# Patient Record
Sex: Male | Born: 1975 | Race: Black or African American | Hispanic: No | Marital: Married | Smoking: Never smoker
Health system: Southern US, Community
[De-identification: ages and names within clinical notes are randomized; demographics above are authoritative.]

## PROBLEM LIST (undated history)

## (undated) DIAGNOSIS — R51 Headache: Secondary | ICD-10-CM

## (undated) DIAGNOSIS — K219 Gastro-esophageal reflux disease without esophagitis: Secondary | ICD-10-CM

## (undated) HISTORY — DX: Headache: R51

## (undated) HISTORY — PX: NO PAST SURGERIES: SHX2092

## (undated) HISTORY — DX: Gastro-esophageal reflux disease without esophagitis: K21.9

---

## 2004-07-16 ENCOUNTER — Encounter: Admission: RE | Admit: 2004-07-16 | Discharge: 2004-07-16 | Payer: Self-pay | Admitting: Internal Medicine

## 2005-03-18 ENCOUNTER — Ambulatory Visit: Payer: Self-pay | Admitting: Family Medicine

## 2005-03-21 ENCOUNTER — Ambulatory Visit (HOSPITAL_COMMUNITY): Admission: RE | Admit: 2005-03-21 | Discharge: 2005-03-21 | Payer: Self-pay | Admitting: Family Medicine

## 2005-05-16 ENCOUNTER — Ambulatory Visit: Payer: Self-pay | Admitting: Internal Medicine

## 2005-06-06 ENCOUNTER — Ambulatory Visit (HOSPITAL_COMMUNITY): Admission: RE | Admit: 2005-06-06 | Discharge: 2005-06-06 | Payer: Self-pay | Admitting: Internal Medicine

## 2005-06-06 ENCOUNTER — Ambulatory Visit: Payer: Self-pay | Admitting: Internal Medicine

## 2005-06-13 ENCOUNTER — Ambulatory Visit (HOSPITAL_COMMUNITY): Admission: RE | Admit: 2005-06-13 | Discharge: 2005-06-13 | Payer: Self-pay | Admitting: Internal Medicine

## 2006-02-20 ENCOUNTER — Ambulatory Visit: Payer: Self-pay | Admitting: Family Medicine

## 2006-09-06 ENCOUNTER — Ambulatory Visit: Payer: Self-pay | Admitting: Family Medicine

## 2006-10-03 DIAGNOSIS — I10 Essential (primary) hypertension: Secondary | ICD-10-CM | POA: Insufficient documentation

## 2006-12-27 ENCOUNTER — Encounter: Payer: Self-pay | Admitting: Family Medicine

## 2006-12-27 ENCOUNTER — Ambulatory Visit: Payer: Self-pay | Admitting: Family Medicine

## 2006-12-27 DIAGNOSIS — J029 Acute pharyngitis, unspecified: Secondary | ICD-10-CM

## 2006-12-27 DIAGNOSIS — J069 Acute upper respiratory infection, unspecified: Secondary | ICD-10-CM | POA: Insufficient documentation

## 2006-12-27 LAB — CONVERTED CEMR LAB: Rapid Strep: NEGATIVE

## 2007-06-09 IMAGING — NM NM HEPATO W/GB/PHARM/[PERSON_NAME]
2 series · 12 of 12 positions shown · non-contrast
Comparison: none

HISTORY: Abdominal pain

[Series 1: hepatobiliary · 3.20mm/px · 6 of 60 frames shown (1 of 2)]
[frame 6/60]
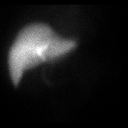
[frame 16/60]
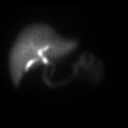
[frame 26/60]
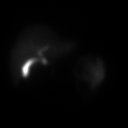
[frame 36/60]
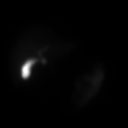
[frame 46/60]
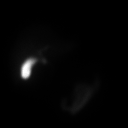
[frame 56/60]
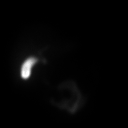

[Series 1: hepatobiliary · 3.20mm/px · 6 of 60 frames shown (2 of 2)]
[frame 6/60]
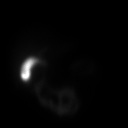
[frame 16/60]
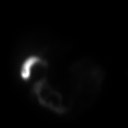
[frame 26/60]
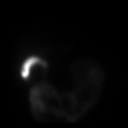
[frame 36/60]
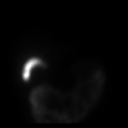
[frame 46/60]
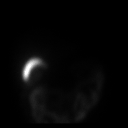
[frame 56/60]
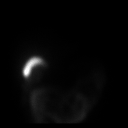

[12 of 12 positions shown; findings below may reference images not displayed]

HEPATOBILIARY SCAN WITH EJECTION FRACTION:

Hepatobiliary imaging performed using 5 mCi Rc-QQm mebrofenin.

Prompt tracer extraction from blood stream, indicating normal hepatocellular
function.
Prompt excretion into biliary tree.
Gallbladder visualized x 15 minutes.
Small bowel activity seen x 10 minutes.
No hepatic retention of tracer.

At 1 hour, patient ingested half-and-half, and imaging was continued for 60
minutes.
Mild emptying of tracer occurs from gallbladder following fatty meal
stimulation.
Calculated gallbladder ejection fraction mildly decreased at 36%.
IMPRESSION: Patent biliary tree.
Mildly decreased gallbladder response to fatty meal stimulation as above.

## 2007-11-05 ENCOUNTER — Ambulatory Visit: Payer: Self-pay | Admitting: Family Medicine

## 2007-11-05 DIAGNOSIS — R1012 Left upper quadrant pain: Secondary | ICD-10-CM

## 2008-02-19 ENCOUNTER — Ambulatory Visit: Payer: Self-pay | Admitting: Family Medicine

## 2008-02-19 DIAGNOSIS — J329 Chronic sinusitis, unspecified: Secondary | ICD-10-CM | POA: Insufficient documentation

## 2008-04-22 ENCOUNTER — Telehealth: Payer: Self-pay | Admitting: Family Medicine

## 2008-06-19 ENCOUNTER — Ambulatory Visit: Payer: Self-pay | Admitting: Family Medicine

## 2008-06-19 DIAGNOSIS — H9190 Unspecified hearing loss, unspecified ear: Secondary | ICD-10-CM | POA: Insufficient documentation

## 2008-06-20 ENCOUNTER — Encounter: Payer: Self-pay | Admitting: Family Medicine

## 2008-06-20 LAB — CONVERTED CEMR LAB
AST: 17 units/L (ref 0–37)
Albumin: 4.7 g/dL (ref 3.5–5.2)
Alkaline Phosphatase: 51 units/L (ref 39–117)
Calcium: 9.5 mg/dL (ref 8.4–10.5)
Cholesterol, target level: 200 mg/dL
Glucose, Bld: 95 mg/dL (ref 70–99)
LDL Cholesterol: 122 mg/dL — ABNORMAL HIGH (ref 0–99)
LDL Goal: 160 mg/dL
TSH: 0.74 microintl units/mL (ref 0.350–5.50)
Total Bilirubin: 0.7 mg/dL (ref 0.3–1.2)
Total CHOL/HDL Ratio: 4.2
Total Protein: 7.7 g/dL (ref 6.0–8.3)

## 2008-09-17 ENCOUNTER — Ambulatory Visit: Payer: Self-pay | Admitting: Family Medicine

## 2008-09-17 DIAGNOSIS — D485 Neoplasm of uncertain behavior of skin: Secondary | ICD-10-CM | POA: Insufficient documentation

## 2010-12-23 ENCOUNTER — Telehealth: Payer: Self-pay | Admitting: Family Medicine

## 2010-12-24 ENCOUNTER — Ambulatory Visit
Admission: RE | Admit: 2010-12-24 | Discharge: 2010-12-24 | Payer: Self-pay | Source: Home / Self Care | Attending: Family Medicine | Admitting: Family Medicine

## 2011-01-05 ENCOUNTER — Encounter: Payer: Self-pay | Admitting: Family Medicine

## 2011-01-05 LAB — CONVERTED CEMR LAB
ALT: 27 units/L (ref 0–53)
Albumin: 5.1 g/dL (ref 3.5–5.2)
Alkaline Phosphatase: 45 units/L (ref 39–117)
BUN: 12 mg/dL (ref 6–23)
Chloride: 100 meq/L (ref 96–112)
Cholesterol: 198 mg/dL (ref 0–200)
Creatinine, Ser: 0.92 mg/dL (ref 0.40–1.50)
HDL: 44 mg/dL (ref >39)
Sodium: 138 meq/L (ref 135–145)
Total Bilirubin: 0.7 mg/dL (ref 0.3–1.2)
Total Protein: 7.8 g/dL (ref 6.0–8.3)

## 2011-01-27 NOTE — Assessment & Plan Note (Signed)
Summary: HTN f/u   Vital Signs:  Patient profile:   35 year old male Height:      75.5 inches Weight:      331 pounds Pulse rate:   64 / minute BP sitting:   144 / 89  (right arm) Cuff size:   large  Vitals Entered By: Avon Gully CMA, Duncan Dull) (December 24, 2010 2:48 PM) CC: f/u HTN, Hypertension Management   Primary Care Rozalyn Osland:  Linford Arnold, C  CC:  f/u HTN and Hypertension Management.  History of Present Illness: BPs have been in teh 130s/80.    Hypertension History:      He denies headache, chest pain, palpitations, dyspnea with exertion, orthopnea, PND, peripheral edema, visual symptoms, neurologic problems, syncope, and side effects from treatment.  He notes no problems with any antihypertensive medication side effects.        Positive major cardiovascular risk factors include hypertension.  Negative major cardiovascular risk factors include male age less than 21 years old, no history of diabetes, negative family history for ischemic heart disease, and non-tobacco-user status.        Further assessment for target organ damage reveals no history of ASHD, stroke/TIA, or peripheral vascular disease.     Current Medications (verified): 1)  Lisinopril-Hydrochlorothiazide 20-25 Mg  Tabs (Lisinopril-Hydrochlorothiazide) .... Take 1 Tablet By Mouth Once A Day  Allergies (verified): No Known Drug Allergies  Comments:  Nurse/Medical Assistant: The patient's medications and allergies were reviewed with the patient and were updated in the Medication and Allergy Lists. Avon Gully CMA, Duncan Dull) (December 24, 2010 2:48 PM)  Physical Exam  General:  Well-developed,well-nourished,in no acute distress; alert,appropriate and cooperative throughout examination Lungs:  Normal respiratory effort, chest expands symmetrically. Lungs are clear to auscultation, no crackles or wheezes. Heart:  Normal rate and regular rhythm. S1 and S2 normal without gallop, murmur, click, rub or other  extra sounds. No carotid bruits. Pulses:  Radial 2+  Extremities:  No LE edema.  Skin:  no rashes.     Impression & Recommendations:  Problem # 1:  HYPERTENSION, BENIGN SYSTEMIC (ICD-401.1) Up a little today but he checks it regulary and it has been at goal.    REfill meds for one year. Needs to get labs. Call if BPs over 140/90.   His updated medication list for this problem includes:    Lisinopril-hydrochlorothiazide 20-25 Mg Tabs (Lisinopril-hydrochlorothiazide) .Marland Kitchen... Take 1 tablet by mouth once a day  Orders: T-Comprehensive Metabolic Panel [phone]) T-Lipid Profile ([id number])  BP today: 144/89 Prior BP: 130/82 (09/17/2008)  Prior 10 Yr Risk Heart Disease: 4 % (06/20/2008)  Labs Reviewed: K+: 4.7 (06/19/2008) Creat: : 1.02 (06/19/2008)   Chol: 186 (06/19/2008)   HDL: 44 (06/19/2008)   LDL: 122 (06/19/2008)   TG: 102 (06/19/2008)  Complete Medication List: 1)  Lisinopril-hydrochlorothiazide 20-25 Mg Tabs (Lisinopril-hydrochlorothiazide) .... Take 1 tablet by mouth once a day  Hypertension Assessment/Plan:      The patient's hypertensive risk group is category A: No risk factors and no target organ damage.  His calculated 10 year risk of coronary heart disease is 7 %.  Today's blood pressure is 144/89.  His blood pressure goal is < 140/90.  Patient Instructions: 1)  Please schedule a follow-up appointment in 1 year.  2)  Please go to the lab in the next couple of weeks. Fast 8 hours. You can have water and take any meds.  Prescriptions: LISINOPRIL-HYDROCHLOROTHIAZIDE 20-25 MG  TABS (LISINOPRIL-HYDROCHLOROTHIAZIDE) Take 1 tablet by mouth once a day  #  90 x 3   Entered and Authorized by:   Nani Gasser MD   Signed by:   Nani Gasser MD on 12/24/2010   Method used:   Electronically to        [facility] [phone]* (retail)       [address] [place], [place]  [address]       Ph: [phone]       Fax: [phone]   RxID:    [id number]    Orders Added: 1)  T-Comprehensive Metabolic Panel [[id number]] 2)  T-Lipid Profile [[id number]] 3)  Est. Patient Level III [[address]]

## 2011-01-27 NOTE — Progress Notes (Signed)
Summary: HTN meds  ---- Converted from flag ---- ---- 12/22/2010 3:29 PM, Janeal Holmes wrote: DR. Linford Arnold,  I CALLED [facility] AND SCHEDULED AN APPOINTMENT TO F/UP HTN/MED RE-FILL FOR THIS FRIDAY 12/24/10. PT STATES HE ONLY HAS 2 PILLS REMAINING.Lynford Humphrey, VENICE  ---- 12/22/2010 8:02 AM, Nani Gasser MD wrote: Pt needs appt. to f/u HTN.  Hasn't been seen in over a year for med refills. ------------------------------  Prescriptions: LISINOPRIL-HYDROCHLOROTHIAZIDE 20-25 MG  TABS (LISINOPRIL-HYDROCHLOROTHIAZIDE) Take 1 tablet by mouth once a day  #30 x 0   Entered by:   Avon Gully CMA, (AAMA)   Authorized by:   Nani Gasser MD   Signed by:   Avon Gully CMA, Duncan Dull) on 12/23/2010   Method used:   Electronically to        [facility] [phone]* (retail)       [address] [place], [place]  [address]       Ph: [phone]       Fax: [phone]   RxID:   [id number]  Call pt: OK we can send 30 day supply to a local pharm. Which pharm would he like? December 29, 20119:36 AM Linford Arnold MD, Santina Evans   1:53 PM December 23, 2010 [place] CMA, Duncan Dull), Sue Lush spoke with pt and will send rx

## 2011-02-05 ENCOUNTER — Encounter: Payer: Self-pay | Admitting: Family Medicine

## 2011-03-03 NOTE — Medication Information (Signed)
Summary: Nonadherence with Lisinopril/[facility]  Nonadherence with Lisinopril/[facility]   Imported By: Lanelle Bal 02/24/2011 12:51:47  _____________________________________________________________________  External Attachment:    Type:   Image     Comment:   External Document

## 2011-05-05 ENCOUNTER — Encounter: Payer: Self-pay | Admitting: Family Medicine

## 2011-05-06 ENCOUNTER — Encounter: Payer: Self-pay | Admitting: Family Medicine

## 2011-05-06 ENCOUNTER — Ambulatory Visit (INDEPENDENT_AMBULATORY_CARE_PROVIDER_SITE_OTHER): Payer: PRIVATE HEALTH INSURANCE | Admitting: Family Medicine

## 2011-05-06 VITALS — BP 125/74 | HR 80 | Wt 318.0 lb

## 2011-05-06 DIAGNOSIS — N390 Urinary tract infection, site not specified: Secondary | ICD-10-CM

## 2011-05-06 LAB — POCT URINALYSIS DIPSTICK
Bilirubin, UA: NEGATIVE
Glucose, UA: NEGATIVE
Ketones, UA: NEGATIVE
Leukocytes, UA: NEGATIVE
Protein, UA: NEGATIVE
Spec Grav, UA: >=1.03

## 2011-05-06 MED ORDER — DOXYCYCLINE HYCLATE 100 MG PO TABS: 100.0000 mg | ORAL_TABLET | Freq: Two times a day (BID) | ORAL | Status: AC

## 2011-05-06 NOTE — Progress Notes (Signed)
  Subjective:    Patient ID: Chad Lawson, male    DOB: August 01, 1976, 35 y.o.   MRN: [id number]  HPI Pelvic pressure. No pain but feels like using the bathroom. Starrted a week ago.  Urine looked dark.  Urine looks more normal now. Did increase water intake and stop drinking soda. No fever.  Maybe some burning with urination. No hx of prostate problems. No raiitaion into the testicles. He has never had a urinary infection before. No problems with his kidneys that he knows of.   Review of Systems     Objective:   Physical Exam  Constitutional: He is oriented to person, place, and time. He appears well-developed and well-nourished.  HENT:  Head: Normocephalic and atraumatic.  Cardiovascular: Normal rate, regular rhythm and normal heart sounds.   Pulmonary/Chest: Effort normal and breath sounds normal.  Abdominal: Soft. Bowel sounds are normal. He exhibits no distension and no mass. There is no tenderness. There is no rebound and no guarding.  Musculoskeletal: He exhibits no edema.  Neurological: He is alert and oriented to person, place, and time.  Skin: Skin is warm and dry.          Assessment & Plan:  Dysuria-UA was negative. It is possible he has prostatitis. All questions and a prescription for doxycycline for 21 days. I did send a urine for culture the likely will be negative. I did not examine the prostate today. If his symptoms do not improve in the next 5-7 days on antibiotic and he is to return for followup visit.

## 2011-05-11 ENCOUNTER — Telehealth: Payer: Self-pay | Admitting: Family Medicine

## 2011-05-11 LAB — URINE CULTURE: Colony Count: NO GROWTH

## 2011-05-11 NOTE — Telephone Encounter (Signed)
Call patient: The urine culture is negative. If he feeling any better on antibiotics?

## 2011-05-11 NOTE — Telephone Encounter (Signed)
Left message on pt's vm with results and to call back if still having sx

## 2011-05-13 NOTE — Op Note (Signed)
NAME:  Chad Lawson, Chad Lawson             ACCOUNT NO.:  [id number]   MEDICAL RECORD NO.:  [id number]          PATIENT TYPE:  AMB   LOCATION:  DAY                           FACILITY:  [facility]   PHYSICIAN:  R. Roetta Sessions, M.D. DATE OF BIRTH:  24-Nov-1976   DATE OF PROCEDURE:  06/06/2005  DATE OF DISCHARGE:                                 OPERATIVE REPORT   PROCEDURE:  Diagnostic esophagogastroduodenoscopy.   INDICATION FOR PROCEDURE:  A 35 year old gentleman with intermittent  epigastric bloating, epigastric and right upper quadrant abdominal  discomfort.  Normal LFTs and gallbladder ultrasound.  He has been on Aciphex  20 mg orally daily for two weeks without much improvement in his symptoms.  EGD is now being done to further evaluate his symptoms.  This approach has  been discussed with the patient at length, the potential risks, benefits,  and alternatives have been reviewed, questions answered.  He is agreeable.  Please see documentation in the medical record.   PROCEDURE NOTE:  O2 saturation, blood pressure, pulse, and respiration were  monitored throughout the entire procedure.   CONSCIOUS SEDATION:  Versed 3 mg IV, Demerol 75 mg IV in divided doses.   INSTRUMENT USED:  Olympus video chip system.   FINDINGS:  Examination of the tubular esophagus revealed no mucosal  abnormalities.  The EG junction was easily traversed.   Stomach:  The gastric cavity was empty and insufflated well with air.  A  thorough examination of the gastric mucosa including a retroflexed view of  the proximal stomach and esophagogastric junction demonstrated no  abnormalities.  The pylorus was patent and easily traversed.  Examination of  the bulb and second portion revealed no abnormalities.   Therapeutic/diagnostic maneuvers performed:  None.   The patient tolerated the procedure well, was reacted in endoscopy.   IMPRESSION:  Normal esophagus, stomach, D1, D2.   RECOMMENDATIONS:  1.  Continue Aciphex 20  mg orally daily.  2.  I believe we should go ahead and evaluate the gallbladder further.  To      this end, we will proceed with a HIDA scan in the very near future.      Further recommendations to follow.       RMR/MEDQ  D:  06/06/2005  T:  06/06/2005  Job:  [id number]   cc:   Dorthula Rue. Early Chars, MD  Fax: [phone]

## 2011-05-13 NOTE — Consult Note (Signed)
NAME:  JAISE, MOSER             ACCOUNT NO.:  [id number]   MEDICAL RECORD NO.:  [id number]          PATIENT TYPE:  AMB   LOCATION:  DAY                           FACILITY:  [facility]   PHYSICIAN:  R. Roetta Sessions, M.D. DATE OF BIRTH:  1976/10/17   DATE OF CONSULTATION:  DATE OF DISCHARGE:                                   CONSULTATION   REASON FOR CONSULTATION:  Upper abdominal discomfort.   HISTORY OF PRESENT ILLNESS:  Mr. Jess Toney is a pleasant 35-  year-old African-American male seen through the courtesy of Dr. Tarri Abernethy  here in [place] to further evaluate at least a 73-month history of vague  upper abdominal bloating and epigastric discomfort with some radiation to  the right upper quadrant and he also describes uncomfortable feeling in his  lower retrosternal area which he does describe as some heartburn from time  to time.  There has been no odynophagia, dysphagia, nausea or vomiting.  The  symptoms are not clearly related to eating or having a bowel movement.  He  may wake up with the symptoms and may go to bed with them and they come and  go throughout the day and they last a couple of hours at a time.  He never  has a day when he is symptom free.  He has been on at least Nexium and  Prevacid briefly without any perceived improvement in his symptoms.  He has  not lost any weight.  He has had no melena or constipation, diarrhea or  hematochezia.  There is no family history of GI neoplasia.  Workup thus far  has included a CBC which revealed minimal leukopenia with a white count of  3.3, hemoglobin 15.9, hematocrit 48.2, and MCV 87.  LFTs were completely  normal.  An ultrasound of the gallbladder was also completely normal.   PAST MEDICAL HISTORY:  Hypertension, he takes Avapro.   PAST SURGICAL HISTORY:  None.   CURRENT MEDICATIONS:  Avapro 150 mg daily orally.   ALLERGIES:  NO KNOWN DRUG ALLERGIES.   FAMILY HISTORY:  Mother is alive and in good health.   Father died with an MI  at age 45.  Otherwise negative.   SOCIAL HISTORY:  The patient is married, he has no children.  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  No tobacco, no alcohol,  no illicit drugs.   REVIEW OF SYSTEMS:  No chest pain, no dyspnea on exertion, no fever or  chills.  He may have lost 10 pounds recently, to obtain a more healthy body  mass index.   PHYSICAL EXAMINATION:  GENERAL:  A healthy-appearing 35 year old gentleman  resting comfortably, weight 302, height 6 feet 4 inches.  VITAL SIGNS:  Temperature 98.1, blood pressure 120/74, pulse 64.  SKIN:  Warm and dry.  HEENT:  No scleral icterus.  Oral cavity with no lesions.  JVD is not  prominent.  CHEST:  Lungs are clear to auscultation.  HEART:  Regular rate  and rhythm without murmurs, rubs or gallops.  ABDOMEN:  Obese, positive  bowel sounds,  soft, nontender, without appreciable mass or organomegaly.  EXTREMITIES:  No edema.   IMPRESSION:  Mr. Malon Branton is a pleasant 35 year old gentleman with  vague symptoms which really follow a nebulous category of dyspepsia.  It  sounds like he does have a component of gastroesophageal reflux disease but  has not appreciated a significant improvement with a couple of short courses  of acid suppression therapy.  A gallbladder ultrasound, CBC and LFTs are  reassuring.   I agree with Dr. Early Chars in that he needs to go and have an EGD to evaluate his  upper GI tract directly.  To this end, I have offered him an EGD in the very  near future.  The potential risks, benefits, and alternatives have been  reviewed and questions answered.  In the interim, I have gone ahead and  given him some Aciphex samples one 20 mg tablet each morning before  breakfast.  We will make further recommendations in the very near future.   I would like to thank Dr. Tarri Abernethy for allowing me to see this nice  gentleman.       RMR/MEDQ  D:  05/16/2005  T:  05/16/2005   Job:  [id number]

## 2011-09-26 ENCOUNTER — Ambulatory Visit (INDEPENDENT_AMBULATORY_CARE_PROVIDER_SITE_OTHER): Payer: PRIVATE HEALTH INSURANCE | Admitting: Family Medicine

## 2011-09-26 ENCOUNTER — Encounter: Payer: Self-pay | Admitting: Family Medicine

## 2011-09-26 VITALS — BP 134/81 | HR 77 | Temp 97.6°F | Wt 326.0 lb

## 2011-09-26 DIAGNOSIS — H109 Unspecified conjunctivitis: Secondary | ICD-10-CM

## 2011-09-26 MED ORDER — CIPROFLOXACIN HCL 0.3 % OP SOLN: 1.0000 [drp] | OPHTHALMIC | Status: AC

## 2011-09-26 NOTE — Patient Instructions (Signed)
Call if not better in about 3-4 drops.

## 2011-09-26 NOTE — Progress Notes (Signed)
  Subjective:    Patient ID: Chad Lawson, male    DOB: January 19, 1976, 35 y.o.   MRN: [id number]  HPI 2 weeks ago RT started looking irritated.  Watery and occ crusty/drainage in the AM.  No itching or irritation. No URI sxs. No worsening or allevaiting sxs.    Review of Systems     Objective:   Physical Exam  Constitutional: He appears well-developed and well-nourished.  HENT:  Head: Normocephalic and atraumatic.  Eyes: Pupils are equal, round, and reactive to light.       Sclera are injected bilaterally.  Yellow discharge in the medial corner of RT eye.  EOMI, mild edema under the lid.           Assessment & Plan:  Declined flu vaccine.   Conjunctivitis - Likely bacterial. Will tx with drops. Call if not better in 3-4 days. Complete ABX drops.

## 2011-10-03 ENCOUNTER — Telehealth: Payer: Self-pay | Admitting: Family Medicine

## 2011-10-03 NOTE — Telephone Encounter (Signed)
Pt notified of MD instructions. KJ LPN 

## 2011-10-03 NOTE — Telephone Encounter (Signed)
Call pt: We looked through [facility] and we have no record of Tdap in system. Next time come in we can get you updated if you would like.

## 2012-02-01 ENCOUNTER — Other Ambulatory Visit: Payer: Self-pay | Admitting: Family Medicine

## 2012-02-02 NOTE — Telephone Encounter (Signed)
Needs appointment

## 2012-05-03 ENCOUNTER — Other Ambulatory Visit: Payer: Self-pay | Admitting: Family Medicine

## 2012-06-05 ENCOUNTER — Other Ambulatory Visit: Payer: Self-pay | Admitting: *Deleted

## 2012-06-05 ENCOUNTER — Encounter: Payer: Self-pay | Admitting: Family Medicine

## 2012-06-05 ENCOUNTER — Ambulatory Visit (INDEPENDENT_AMBULATORY_CARE_PROVIDER_SITE_OTHER): Payer: PRIVATE HEALTH INSURANCE | Admitting: Family Medicine

## 2012-06-05 VITALS — BP 120/88 | HR 84 | Wt 317.0 lb

## 2012-06-05 DIAGNOSIS — Z23 Encounter for immunization: Secondary | ICD-10-CM

## 2012-06-05 DIAGNOSIS — I10 Essential (primary) hypertension: Secondary | ICD-10-CM

## 2012-06-05 MED ORDER — LISINOPRIL-HYDROCHLOROTHIAZIDE 20-25 MG PO TABS: 1.0000 | ORAL_TABLET | Freq: Every day | ORAL | Status: DC

## 2012-06-05 NOTE — Progress Notes (Signed)
Addended by: Wyline Beady on: 06/05/2012 03:23 PM   Modules accepted: Orders

## 2012-06-05 NOTE — Patient Instructions (Signed)

## 2012-06-05 NOTE — Progress Notes (Addendum)
  Subjective:    Patient ID: Chad Lawson, male    DOB: Oct 15, 1976, 36 y.o.   MRN: [id number]  HPI HTN- no CP or SOB.  Ate some pork on pizza.  hasn't eaten today. No regular exercise but has been trying to play basketball in a league.  Taking meds regularly. Takes at bedtime.  No other complaints.     Review of Systems     Objective:   Physical Exam  Constitutional: He is oriented to person, place, and time. He appears well-developed and well-nourished.  HENT:  Head: Normocephalic and atraumatic.  Cardiovascular: Normal rate, regular rhythm and normal heart sounds.   Pulmonary/Chest: Effort normal and breath sounds normal.  Neurological: He is alert and oriented to person, place, and time.  Skin: Skin is warm and dry.  Psychiatric: He has a normal mood and affect. His behavior is normal.          Assessment & Plan:  HTN- well controlled. F/U in 6 monthsl Due for CMP and lipid panel. Will go today. \  Tdap given today.

## 2012-06-05 NOTE — Progress Notes (Signed)
Addended by: Nani Gasser D on: 06/05/2012 03:25 PM   Modules accepted: Level of Service

## 2012-06-06 LAB — COMPLETE METABOLIC PANEL WITH GFR
Albumin: 4.8 g/dL (ref 3.5–5.2)
Alkaline Phosphatase: 41 U/L (ref 39–117)
CO2: 23 mEq/L (ref 19–32)
Calcium: 9.9 mg/dL (ref 8.4–10.5)
Chloride: 104 mEq/L (ref 96–112)
GFR, Est African American: >89 mL/min
GFR, Est Non African American: >89 mL/min
Glucose, Bld: 92 mg/dL (ref 70–99)
Potassium: 4.3 mEq/L (ref 3.5–5.3)
Sodium: 141 mEq/L (ref 135–145)

## 2012-06-06 LAB — LIPID PANEL
Cholesterol: 193 mg/dL (ref 0–200)
LDL Cholesterol: 132 mg/dL — ABNORMAL HIGH (ref 0–99)
Triglycerides: 72 mg/dL (ref <150)
VLDL: 14 mg/dL (ref 0–40)

## 2012-11-30 ENCOUNTER — Ambulatory Visit (INDEPENDENT_AMBULATORY_CARE_PROVIDER_SITE_OTHER): Payer: PRIVATE HEALTH INSURANCE | Admitting: Family Medicine

## 2012-11-30 ENCOUNTER — Encounter: Payer: Self-pay | Admitting: Family Medicine

## 2012-11-30 VITALS — BP 143/92 | HR 85 | Ht 76.0 in | Wt 320.0 lb

## 2012-11-30 DIAGNOSIS — R109 Unspecified abdominal pain: Secondary | ICD-10-CM

## 2012-11-30 DIAGNOSIS — R3915 Urgency of urination: Secondary | ICD-10-CM

## 2012-11-30 LAB — POCT URINALYSIS DIPSTICK
Ketones, UA: NEGATIVE
Protein, UA: NEGATIVE
Urobilinogen, UA: 0.2
pH, UA: 6

## 2012-11-30 MED ORDER — CIPROFLOXACIN HCL 500 MG PO TABS: 500.0000 mg | ORAL_TABLET | Freq: Two times a day (BID) | ORAL | Status: AC

## 2012-11-30 NOTE — Progress Notes (Signed)
  Subjective:    Patient ID: Chad Lawson, male    DOB: 1976-11-22, 36 y.o.   MRN: [id number]  HPI Lower abdominal pressure x 2 weeks. Says feels better after a BM.  No blood in the urine or stool. Urinary urgency. No pain into the testicles.  No constipation.  Usually only urinates once at night.  Pain is a little more to the left side. Dec urainry stream since was younger but nothing acute. Has passed a kidney stone once.  No sig pain like back then.    Review of Systems     Objective:   Physical Exam  Constitutional: He is oriented to person, place, and time. He appears well-developed and well-nourished.  HENT:  Head: Normocephalic and atraumatic.  Cardiovascular: Normal rate, regular rhythm and normal heart sounds.   Pulmonary/Chest: Effort normal and breath sounds normal.  Abdominal: Soft. Bowel sounds are normal. He exhibits no distension and no mass. There is no tenderness. There is no rebound and no guarding.  Neurological: He is alert and oriented to person, place, and time.  Skin: Skin is warm and dry.  Psychiatric: He has a normal mood and affect. His behavior is normal.          Assessment & Plan:  Urinary urgency - his symptoms sound consistent with a urinary tract infection. His urinalysis is negative. We will send for culture. Because it is Friday and go ahead and place him on Cipro and await the culture results. He is having pressure sensation with urgency. No hematuria. He does have some prior history of urinary stones but he's not having any significant discomfort. No sign of constipation or diarrhea that be causing some type of mild colitis. No fever. This could also be mild prostatitis. He's not having pain radiating into the testicles. I recommended further evaluation the prostate the patient declined today. He says if he doesn't get better he will let me know.  Pelvic pressure- see above.

## 2012-12-02 LAB — URINE CULTURE: Colony Count: 75000

## 2013-01-07 ENCOUNTER — Other Ambulatory Visit: Payer: Self-pay | Admitting: Family Medicine

## 2013-06-20 ENCOUNTER — Encounter: Payer: Self-pay | Admitting: Sports Medicine

## 2013-06-20 ENCOUNTER — Ambulatory Visit (INDEPENDENT_AMBULATORY_CARE_PROVIDER_SITE_OTHER): Payer: PRIVATE HEALTH INSURANCE | Admitting: Sports Medicine

## 2013-06-20 ENCOUNTER — Ambulatory Visit (INDEPENDENT_AMBULATORY_CARE_PROVIDER_SITE_OTHER): Payer: PRIVATE HEALTH INSURANCE

## 2013-06-20 VITALS — BP 140/98 | HR 100 | Wt 313.0 lb

## 2013-06-20 DIAGNOSIS — R0989 Other specified symptoms and signs involving the circulatory and respiratory systems: Secondary | ICD-10-CM

## 2013-06-20 DIAGNOSIS — J209 Acute bronchitis, unspecified: Secondary | ICD-10-CM | POA: Insufficient documentation

## 2013-06-20 DIAGNOSIS — J029 Acute pharyngitis, unspecified: Secondary | ICD-10-CM

## 2013-06-20 MED ORDER — HYDROCOD POLST-CHLORPHEN POLST 10-8 MG/5ML PO LQCR: 5.0000 mL | Freq: Two times a day (BID) | ORAL | Status: DC | PRN

## 2013-06-20 MED ORDER — FLUTICASONE PROPIONATE 50 MCG/ACT NA SUSP: NASAL | Status: DC

## 2013-06-20 NOTE — Assessment & Plan Note (Signed)
Chest x-ray, Tussionex, Flonase. Return as needed, 2 weeks.

## 2013-06-20 NOTE — Patient Instructions (Addendum)
Acute Bronchitis You have acute bronchitis. This means you have a chest cold. The airways in your lungs are red and sore (inflamed). Acute means it is sudden onset.  CAUSES Bronchitis is most often caused by the same virus that causes a cold. SYMPTOMS   Body aches.  Chest congestion.  Chills.  Cough.  Fever.  Shortness of breath.  Sore throat. TREATMENT  Acute bronchitis is usually treated with rest, fluids, and medicines for relief of fever or cough. Most symptoms should go away after a few days or a week. Increased fluids may help thin your secretions and will prevent dehydration. Your caregiver may give you an inhaler to improve your symptoms. The inhaler reduces shortness of breath and helps control cough. You can take over-the-counter pain relievers or cough medicine to decrease coughing, pain, or fever. A cool-air vaporizer may help thin bronchial secretions and make it easier to clear your chest. Antibiotics are usually not needed but can be prescribed if you smoke, are seriously ill, have chronic lung problems, are elderly, or you are at higher risk for developing complications.Allergies and asthma can make bronchitis worse. Repeated episodes of bronchitis may cause longstanding lung problems. Avoid smoking and secondhand smoke.Exposure to cigarette smoke or irritating chemicals will make bronchitis worse. If you are a cigarette smoker, consider using nicotine gum or skin patches to help control withdrawal symptoms. Quitting smoking will help your lungs heal faster. Recovery from bronchitis is often slow, but you should start feeling better after 2 to 3 days. Cough from bronchitis frequently lasts for 3 to 4 weeks. To prevent another bout of acute bronchitis:  Quit smoking.  Wash your hands frequently to get rid of viruses or use a hand sanitizer.  Avoid other people with cold or virus symptoms.  Try not to touch your hands to your mouth, nose, or eyes. SEEK IMMEDIATE  MEDICAL CARE IF:  You develop increased fever, chills, or chest pain.  You have severe shortness of breath or bloody sputum.  You develop dehydration, fainting, repeated vomiting, or a severe headache.  You have no improvement after 1 week of treatment or you get worse. MAKE SURE YOU:   Understand these instructions.  Will watch your condition.  Will get help right away if you are not doing well or get worse. Document Released: 01/19/2005 Document Revised: 03/05/2012 Document Reviewed: 04/06/2011 [employer] Patient Information 2014 [employer].  

## 2013-06-20 NOTE — Progress Notes (Signed)
  Subjective:    CC: Cough  HPI: This is a very pleasant 37 year old male with a several day history of nonproductive cough, and runny nose. He denies sick contacts, no fevers, chills, or other constitutional symptoms, no rash, chest pain, no shortness of breath. No GI symptoms. Cough is mild. Symptoms are mild, persistent.  Past medical history, Surgical history, Family history not pertinant except as noted below, Social history, Allergies, and medications have been entered into the medical record, reviewed, and no changes needed.   Review of Systems: No fevers, chills, night sweats, weight loss, chest pain, or shortness of breath.   Objective:    General: Well Developed, well nourished, and in no acute distress.  Neuro: Alert and oriented x3, extra-ocular muscles intact, sensation grossly intact.  HEENT: Normocephalic, atraumatic, pupils equal round reactive to light, neck supple, no masses, no lymphadenopathy, thyroid nonpalpable. Oropharynx, nasopharynx, external ear canals reviewed, unremarkable with the exception of boggy erythematous turbinates. Skin: Warm and dry, no rashes. Cardiac: Regular rate and rhythm, no murmurs rubs or gallops, no lower extremity edema.  Respiratory: Minimally coarse sounds in the left lower lobe. Not using accessory muscles, speaking in full sentences.  X-ray was reviewed and are negative for acute cardiopulmonary disease. Impression and Recommendations:

## 2013-07-09 ENCOUNTER — Other Ambulatory Visit: Payer: Self-pay | Admitting: Family Medicine

## 2013-07-10 ENCOUNTER — Other Ambulatory Visit: Payer: Self-pay | Admitting: *Deleted

## 2013-07-10 MED ORDER — LISINOPRIL-HYDROCHLOROTHIAZIDE 20-25 MG PO TABS: ORAL_TABLET | ORAL | Status: DC

## 2013-07-12 ENCOUNTER — Encounter: Payer: Self-pay | Admitting: Family Medicine

## 2013-07-12 ENCOUNTER — Ambulatory Visit (INDEPENDENT_AMBULATORY_CARE_PROVIDER_SITE_OTHER): Payer: PRIVATE HEALTH INSURANCE | Admitting: Family Medicine

## 2013-07-12 VITALS — BP 122/86 | HR 84 | Ht 76.0 in | Wt 313.0 lb

## 2013-07-12 DIAGNOSIS — I10 Essential (primary) hypertension: Secondary | ICD-10-CM

## 2013-07-12 DIAGNOSIS — J309 Allergic rhinitis, unspecified: Secondary | ICD-10-CM

## 2013-07-12 MED ORDER — LISINOPRIL-HYDROCHLOROTHIAZIDE 20-25 MG PO TABS: ORAL_TABLET | ORAL | Status: DC

## 2013-07-12 NOTE — Progress Notes (Signed)
  Subjective:    Patient ID: Chad Lawson, male    DOB: Mar 22, 1976, 37 y.o.   MRN: [id number]  HPI  Pt denies chest pain, SOB, dizziness, or heart palpitations.  Taking meds as directed w/o problems.  Denies medication side effects.    Has a lot of post nasal drip with occ cough.  No SOB.  No ST or fever. No wheezing.  Recently treated for acute bronchitis. He is done with the cough medication.     Review of Systems     Objective:   Physical Exam  Constitutional: He is oriented to person, place, and time. He appears well-developed and well-nourished.  HENT:  Head: Normocephalic and atraumatic.  Cardiovascular: Normal rate, regular rhythm and normal heart sounds.   Pulmonary/Chest: Effort normal and breath sounds normal.  Neurological: He is alert and oriented to person, place, and time.  Skin: Skin is warm and dry.  Psychiatric: He has a normal mood and affect. His behavior is normal.          Assessment & Plan:  HTN- well controlled. Continue current regimen. Followup in 6 months. Call if any problems or side effects.  Allergic rhinitis-impression to restart his nasal steroid spray and see if the postnasal drip and cough improved. Lungs sound great today. Call if any concerns or if he feels it is getting worse, spikes a fever or starts to develop significant headache or sinus pressure.

## 2013-07-13 LAB — LIPID PANEL
Cholesterol: 200 mg/dL (ref 0–200)
HDL: 45 mg/dL (ref >39)
Total CHOL/HDL Ratio: 4.4 Ratio
VLDL: 16 mg/dL (ref 0–40)

## 2013-07-13 LAB — COMPLETE METABOLIC PANEL WITH GFR
ALT: 27 U/L (ref 0–53)
BUN: 10 mg/dL (ref 6–23)
CO2: 27 mEq/L (ref 19–32)
Chloride: 99 mEq/L (ref 96–112)
Creat: 0.98 mg/dL (ref 0.50–1.35)
GFR, Est African American: >89 mL/min
GFR, Est Non African American: >89 mL/min
Total Bilirubin: 0.9 mg/dL (ref 0.3–1.2)

## 2014-02-18 ENCOUNTER — Ambulatory Visit (INDEPENDENT_AMBULATORY_CARE_PROVIDER_SITE_OTHER): Payer: PRIVATE HEALTH INSURANCE | Admitting: Physician Assistant

## 2014-02-18 ENCOUNTER — Encounter: Payer: Self-pay | Admitting: Physician Assistant

## 2014-02-18 VITALS — BP 138/80 | HR 94 | Wt 325.0 lb

## 2014-02-18 DIAGNOSIS — E669 Obesity, unspecified: Secondary | ICD-10-CM

## 2014-02-18 DIAGNOSIS — I10 Essential (primary) hypertension: Secondary | ICD-10-CM

## 2014-02-18 LAB — BASIC METABOLIC PANEL WITH GFR
BUN: 12 mg/dL (ref 6–23)
CHLORIDE: 97 meq/L (ref 96–112)
CO2: 29 meq/L (ref 19–32)
CREATININE: 0.93 mg/dL (ref 0.50–1.35)
Calcium: 9.8 mg/dL (ref 8.4–10.5)
GFR, Est Non African American: >89 mL/min
GLUCOSE: 237 mg/dL — AB (ref 70–99)
POTASSIUM: 4.3 meq/L (ref 3.5–5.3)
SODIUM: 135 meq/L (ref 135–145)

## 2014-02-18 MED ORDER — LISINOPRIL-HYDROCHLOROTHIAZIDE 20-25 MG PO TABS: ORAL_TABLET | ORAL | Status: DC

## 2014-02-18 NOTE — Patient Instructions (Signed)

## 2014-02-18 NOTE — Progress Notes (Signed)
   Subjective:    Patient ID: Chad Lawson, male    DOB: 1976-03-04, 38 y.o.   MRN: [id number]  HPI Pt presents to the clinic to follow up on HTN and get medication refill. Denies any problems with medication. No CP, palpitations, Headaches, vision changes. He is completely out of BP medication today.    Review of Systems     Objective:   Physical Exam  Constitutional: He is oriented to person, place, and time. He appears well-developed and well-nourished.  Obese.  HENT:  Head: Normocephalic and atraumatic.  Cardiovascular: Normal rate, regular rhythm and normal heart sounds.   Pulmonary/Chest: Effort normal and breath sounds normal. He has no wheezes.  Neurological: He is alert and oriented to person, place, and time.  Skin: Skin is warm and dry.  Psychiatric: He has a normal mood and affect. His behavior is normal.          Assessment & Plan:  HTN- refilled lisinopril/HCTZ. Pt did not have BP medication this am. Will check cmp today. Follow up in 6 months.   Obesity- discussed the need to lose some weight. This would also help his blood pressure. Pt is aware of this need but not ready to make any big decisions today. Pt is going to try to be more diet conscious and stay active.   Discussed need for CPE in near future. Pt aware.

## 2014-02-25 ENCOUNTER — Encounter: Payer: Self-pay | Admitting: Family Medicine

## 2014-02-25 ENCOUNTER — Ambulatory Visit (INDEPENDENT_AMBULATORY_CARE_PROVIDER_SITE_OTHER): Payer: PRIVATE HEALTH INSURANCE | Admitting: Family Medicine

## 2014-02-25 VITALS — BP 150/101 | HR 97 | Wt 321.0 lb

## 2014-02-25 DIAGNOSIS — R7309 Other abnormal glucose: Secondary | ICD-10-CM

## 2014-02-25 DIAGNOSIS — E119 Type 2 diabetes mellitus without complications: Secondary | ICD-10-CM

## 2014-02-25 LAB — POCT GLYCOSYLATED HEMOGLOBIN (HGB A1C): Hemoglobin A1C: 8.9

## 2014-02-25 NOTE — Progress Notes (Signed)
CC: Chad Lawson is a 38 y.o. male is here for Hyperglycemia   Subjective: HPI:  Followup hyperglycemia: On a random metabolic panel last week glucose was found to be in the low 200s. He has a family history of diabetes in his father and mother. He has never been told he has elevated blood sugar in the past. Prior to being seen last week he had a bowl of cereal in our waiting room which he thinks may have contributed to his elevated blood sugar.  He admits that he eats more cakes and carbohydrates than he probably needs this is complicated by his mother-in-law's cooking habits. He admits to polydipsia but denies polyuria. He denies poorly healing wounds nor vision loss nor motor or sensory disturbances. He does not work on a regular basis. He has tried to cut back on carbohydrate intake since we saw him last and he has noticed 3-4 pounds of weight loss.  Denies unintentional weight gain, fatigue, abdominal pain, GI disturbance.   Review Of Systems Outlined In HPI  Past Medical History  Diagnosis Date  . Headache(784.0)   . GERD (gastroesophageal reflux disease)     No past surgical history on file. Family History  Problem Relation Age of Onset  . Hypertension Mother   . Diabetes Mother     severe diabetic retinopathy w/blindness  . Cancer Mother     breast w/mastectomy  . Stroke Father   . Heart disease Father     deceased in 03/18/2002  . Diabetes Father   . Hypertension Father   . Cancer Maternal Grandmother     uterine  . Cancer Maternal Grandfather     lung cancer    History   Social History  . Marital Status: Married    Spouse Name: N/A    Number of Children: N/A  . Years of Education: N/A   Occupational History  . Not on file.   Social History Main Topics  . Smoking status: Never Smoker   . Smokeless tobacco: Not on file  . Alcohol Use: No  . Drug Use: No  . Sexual Activity:    Other Topics Concern  . Not on file   Social History Narrative  . No narrative  on file     Objective: BP 150/101  Pulse 97  Wt 321 lb (145.605 kg)  Vital signs reviewed. General: Alert and Oriented, No Acute Distress HEENT: Pupils equal, round, reactive to light. Conjunctivae clear.  External ears unremarkable.  Moist mucous membranes. Lungs: Clear and comfortable work of breathing, speaking in full sentences without accessory muscle use. Cardiac: Regular rate and rhythm.  Neuro: CN II-XII grossly intact, gait normal. Abdomen: Obese and soft Extremities: No peripheral edema.  Strong peripheral pulses.  Mental Status: No depression, anxiety, nor agitation. Logical though process. Skin: Warm and dry.  Assessment & Plan: Dennis was seen today for hyperglycemia.  Diagnoses and associated orders for this visit:  Elevated glucose - POCT HgB A1C  Type 2 diabetes mellitus    With an A1c of 8.9 today we discussed a formal diagnosis of type 2 diabetes. I've encouraged him to start on metformin however he is adamant about wanting to make lifestyle changes prior to starting medications. He refuses metformin today. He had many well-thought-out questions regarding how to help lower blood sugar.  It sounds like he has a huge potential of improving his blood sugar based on his carbohydrates and simple sugar intake with juices, cakes, bread, and fast food.  I've encouraged him to start a 30 minute walking routine after largest meal of the day he is also about to start recreational basketball week tomorrow. We went over a menu focus on carbohydrate restriction and portion control and he was provided with a handout further emphasizing dietary interventions. I've asked him to return 1-2 months to recheck how his lifestyle interventions are going.  40 minutes spent face-to-face during visit today of which at least 50% was counseling or coordinating care regarding: 1. Elevated glucose   2. Type 2 diabetes mellitus      Return in about 4 weeks (around 03/25/2014) for Diabetic  followup.

## 2014-04-28 ENCOUNTER — Ambulatory Visit (INDEPENDENT_AMBULATORY_CARE_PROVIDER_SITE_OTHER): Payer: PRIVATE HEALTH INSURANCE | Admitting: Family Medicine

## 2014-04-28 ENCOUNTER — Encounter: Payer: Self-pay | Admitting: Family Medicine

## 2014-04-28 VITALS — BP 132/81 | HR 76 | Wt 308.0 lb

## 2014-04-28 DIAGNOSIS — I10 Essential (primary) hypertension: Secondary | ICD-10-CM

## 2014-04-28 DIAGNOSIS — E119 Type 2 diabetes mellitus without complications: Secondary | ICD-10-CM

## 2014-04-28 LAB — POCT UA - MICROALBUMIN
Albumin/Creatinine Ratio, Urine, POC: <30
Creatinine, POC: 200 mg/dL
Microalbumin Ur, POC: 10 mg/L

## 2014-04-28 MED ORDER — AMBULATORY NON FORMULARY MEDICATION
Status: DC
Start: 2014-04-28 — End: 2014-04-28

## 2014-04-28 MED ORDER — AMBULATORY NON FORMULARY MEDICATION: Status: DC

## 2014-04-28 NOTE — Progress Notes (Signed)
   Subjective:    Patient ID: Chad Lawson, male    DOB: January 18, 1976, 38 y.o.   MRN: [id number]  HPI New dx of DM - he was seen by one of my partners approximately 2 months ago. He was newly diagnosed with diabetes with an A1c of 8.9. He did not want to start medication and really wanted to make some major lifestyle changes. He is following up today to discuss that. Has lost 13 lbs. Has cut out starches.  Had some inc thrist and urination, and that is now better.    Hypertension- Pt denies chest pain, SOB, dizziness, or heart palpitations.  Taking meds as directed w/o problems.  Denies medication side effects.     Review of Systems     Objective:   Physical Exam  Constitutional: He is oriented to person, place, and time. He appears well-developed and well-nourished.  HENT:  Head: Normocephalic and atraumatic.  Cardiovascular: Normal rate, regular rhythm and normal heart sounds.   Pulmonary/Chest: Effort normal and breath sounds normal.  Neurological: He is alert and oriented to person, place, and time.  Skin: Skin is warm and dry.  Psychiatric: He has a normal mood and affect. His behavior is normal.          Assessment & Plan:  Diabetes- improving. A1c is down to 6.9 from 8.92 months ago. This is fantastic. I want him to continue with his change in diet and exercise and continue to lose weight. I think he did get his A1c under 6.5 without taking medication. congratulated him on weight loss.  Will ge him a glucometer.  He would like to weigh 275.  Will try ot do a urine micro today. We did discuss the possibility of using metformin. He can help him lose weight and even help patients with impaired fasting glucose to stay out of the diabetic range even longer. He will think about it. Followup in 3 months.  HTN - well controlled.  Continue current regimen.

## 2014-08-04 ENCOUNTER — Encounter: Payer: Self-pay | Admitting: Family Medicine

## 2014-08-04 ENCOUNTER — Ambulatory Visit (INDEPENDENT_AMBULATORY_CARE_PROVIDER_SITE_OTHER): Payer: PRIVATE HEALTH INSURANCE | Admitting: Family Medicine

## 2014-08-04 VITALS — BP 125/88 | HR 82 | Wt 289.0 lb

## 2014-08-04 DIAGNOSIS — I10 Essential (primary) hypertension: Secondary | ICD-10-CM

## 2014-08-04 DIAGNOSIS — E119 Type 2 diabetes mellitus without complications: Secondary | ICD-10-CM

## 2014-08-04 LAB — POCT GLYCOSYLATED HEMOGLOBIN (HGB A1C): HEMOGLOBIN A1C: 5.8

## 2014-08-04 NOTE — Progress Notes (Signed)
   Subjective:    Patient ID: Chad Lawson, male    DOB: 01-26-1976, 38 y.o.   MRN: [id number]  HPI Diabetes - no hypoglycemic events. No wounds or sores that are not healing well. No increased thirst or urination. Checking glucose at home. Taking medications as prescribed without any side effects. He has been working very hard on diet. He says he's made some major changes. He has not worked out as much as he would like.  Hypertension- Pt denies chest pain, SOB, dizziness, or heart palpitations.  Taking meds as directed w/o problems.  Denies medication side effects.    Review of Systems     Objective:   Physical Exam  Constitutional: He is oriented to person, place, and time. He appears well-developed and well-nourished.  HENT:  Head: Normocephalic and atraumatic.  Cardiovascular: Normal rate, regular rhythm and normal heart sounds.   Pulmonary/Chest: Effort normal and breath sounds normal.  Neurological: He is alert and oriented to person, place, and time.  Skin: Skin is warm and dry.  Psychiatric: He has a normal mood and affect. His behavior is normal.          Assessment & Plan:  DM-at goal, significantly improved. He brought his A1c down from 8.9-5.8, with dietary changes alone which is fantastic. He is not currently on any oral medications. He is due for repeat CMP and lipid panel. Technically this would put him into the impaired fasting glucose range but we'll continue to monitor carefully. Followup in 6 months.  HTN- well controlled.  Continue current regimen. Followup in 6 months.

## 2014-10-06 ENCOUNTER — Other Ambulatory Visit: Payer: Self-pay

## 2014-10-06 MED ORDER — LISINOPRIL-HYDROCHLOROTHIAZIDE 20-25 MG PO TABS: ORAL_TABLET | ORAL | Status: DC

## 2015-02-02 ENCOUNTER — Encounter: Payer: Self-pay | Admitting: Family Medicine

## 2015-02-02 ENCOUNTER — Ambulatory Visit (INDEPENDENT_AMBULATORY_CARE_PROVIDER_SITE_OTHER): Payer: PRIVATE HEALTH INSURANCE | Admitting: Family Medicine

## 2015-02-02 VITALS — BP 131/83 | HR 87 | Ht 76.0 in | Wt 283.0 lb

## 2015-02-02 DIAGNOSIS — R350 Frequency of micturition: Secondary | ICD-10-CM

## 2015-02-02 DIAGNOSIS — I10 Essential (primary) hypertension: Secondary | ICD-10-CM

## 2015-02-02 DIAGNOSIS — E119 Type 2 diabetes mellitus without complications: Secondary | ICD-10-CM

## 2015-02-02 MED ORDER — LISINOPRIL-HYDROCHLOROTHIAZIDE 20-25 MG PO TABS: ORAL_TABLET | ORAL | Status: DC

## 2015-02-02 NOTE — Progress Notes (Signed)
   Subjective:    Patient ID: Chad Lawson, male    DOB: [birth year], 39 y.o.   MRN: [id number]  HPI Diabetes - no hypoglycemic events. No wounds or sores that are not healing well. No increased thirst or urination. Checking glucose at home. Taking medications as prescribed without any side effects. Has lost 6 more pounds since I last saw him. Has been eating more sugar free foods and wants to make sure this is OK.   Hypertension- Pt denies chest pain, SOB, dizziness, or heart palpitations.  Taking meds as directed w/o problems.  Denies medication side effects.    Feels like having some urgency and occ dyuria on and off x 1 months.  No back pain. No fever, chills, or sweats. No blood in urine. No hx of kidney stones.  No pain.   Review of Systems     Objective:   Physical Exam  Constitutional: He is oriented to person, place, and time. He appears well-developed and well-nourished.  HENT:  Head: Normocephalic and atraumatic.  Cardiovascular: Normal rate, regular rhythm and normal heart sounds.   Pulmonary/Chest: Effort normal and breath sounds normal.  Neurological: He is alert and oriented to person, place, and time.  Skin: Skin is warm and dry.  Psychiatric: He has a normal mood and affect. His behavior is normal.          Assessment & Plan:  DM- well controlled. A1c is 6.1 today which is fantastic but it is up a little bit than previous. Discussed could go ahead and start a medication. He wants to work on diet and exercise first.  Follow-up in 4 months. Encouraged him to get a full eye exam. He has had a screening through work.  HTN - well controlled continue current regimen. Hypertension-well-controlled. Continue current regimen.  Urinary frequency-will check urinalysis for UTI. He had similar symptoms about 2 years ago and responded well to antibiotic.

## 2015-02-03 LAB — POCT URINALYSIS DIPSTICK
Bilirubin, UA: NEGATIVE
Glucose, UA: NEGATIVE
Ketones, UA: NEGATIVE
LEUKOCYTES UA: NEGATIVE
NITRITE UA: NEGATIVE
PROTEIN UA: NEGATIVE
RBC UA: NEGATIVE
SPEC GRAV UA: 1.015
UROBILINOGEN UA: 1
pH, UA: 7

## 2015-02-03 LAB — COMPLETE METABOLIC PANEL WITH GFR
ALBUMIN: 4.8 g/dL (ref 3.5–5.2)
ALT: 21 U/L (ref 0–53)
AST: 18 U/L (ref 0–37)
Alkaline Phosphatase: 46 U/L (ref 39–117)
BUN: 15 mg/dL (ref 6–23)
CALCIUM: 9.5 mg/dL (ref 8.4–10.5)
CHLORIDE: 99 meq/L (ref 96–112)
CO2: 29 mEq/L (ref 19–32)
Creat: 0.93 mg/dL (ref 0.50–1.35)
GFR, Est African American: >89 mL/min
GFR, Est Non African American: >89 mL/min
Glucose, Bld: 118 mg/dL — ABNORMAL HIGH (ref 70–99)
POTASSIUM: 4.2 meq/L (ref 3.5–5.3)
Sodium: 138 mEq/L (ref 135–145)
Total Bilirubin: 0.8 mg/dL (ref 0.2–1.2)
Total Protein: 7.5 g/dL (ref 6.0–8.3)

## 2015-02-03 LAB — POCT GLYCOSYLATED HEMOGLOBIN (HGB A1C): HEMOGLOBIN A1C: 6.1

## 2015-02-03 NOTE — Progress Notes (Signed)
Quick Note:  All labs are normal. ______

## 2015-02-04 ENCOUNTER — Encounter: Payer: Self-pay | Admitting: Family Medicine

## 2015-03-23 ENCOUNTER — Telehealth: Payer: Self-pay | Admitting: Family Medicine

## 2015-03-23 DIAGNOSIS — R3915 Urgency of urination: Secondary | ICD-10-CM

## 2015-03-23 NOTE — Telephone Encounter (Signed)
Referral placed.

## 2015-03-23 NOTE — Telephone Encounter (Signed)
Patient called clinic stating his urinary urgency has not gotten any better and wants to know if you will place a referral to urology. Please advise.

## 2015-03-24 NOTE — Telephone Encounter (Signed)
Pt advised referral has been placed. No further questions.

## 2015-06-08 ENCOUNTER — Encounter: Payer: Self-pay | Admitting: Family Medicine

## 2015-06-08 ENCOUNTER — Ambulatory Visit (INDEPENDENT_AMBULATORY_CARE_PROVIDER_SITE_OTHER): Payer: PRIVATE HEALTH INSURANCE | Admitting: Family Medicine

## 2015-06-08 VITALS — BP 125/82 | HR 80 | Ht 76.0 in | Wt 282.0 lb

## 2015-06-08 DIAGNOSIS — I10 Essential (primary) hypertension: Secondary | ICD-10-CM

## 2015-06-08 DIAGNOSIS — Z23 Encounter for immunization: Secondary | ICD-10-CM | POA: Diagnosis not present

## 2015-06-08 DIAGNOSIS — E119 Type 2 diabetes mellitus without complications: Secondary | ICD-10-CM

## 2015-06-08 LAB — POCT UA - MICROALBUMIN
CREATININE, POC: 200 mg/dL
Microalbumin Ur, POC: 10 mg/L

## 2015-06-08 LAB — POCT GLYCOSYLATED HEMOGLOBIN (HGB A1C): Hemoglobin A1C: 5.8

## 2015-06-08 MED ORDER — LISINOPRIL-HYDROCHLOROTHIAZIDE 20-25 MG PO TABS: ORAL_TABLET | ORAL | Status: DC

## 2015-06-08 NOTE — Patient Instructions (Signed)
Try to get your yearly eye exam

## 2015-06-08 NOTE — Progress Notes (Signed)
   Subjective:    Patient ID: ASHAZ ROBLING, male    DOB: 08-08-1976, 39 y.o.   MRN: [id number]  HPI Diabetes - no hypoglycemic events. No wounds or sores that are not healing well. No increased thirst or urination. Checking glucose at home. Taking medications as prescribed without any side effects.  Hypertension- Pt denies chest pain, SOB, dizziness, or heart palpitations.  Taking meds as directed w/o problems.  Denies medication side effects.     Review of Systems     Objective:   Physical Exam  Constitutional: He is oriented to person, place, and time. He appears well-developed and well-nourished.  HENT:  Head: Normocephalic and atraumatic.  Cardiovascular: Normal rate, regular rhythm and normal heart sounds.   Pulmonary/Chest: Effort normal and breath sounds normal.  Neurological: He is alert and oriented to person, place, and time.  Skin: Skin is warm and dry.  Psychiatric: He has a normal mood and affect. His behavior is normal.          Assessment & Plan:  DM- A1C is down to 5.8. Well controlled. F/U in 4 months.  Encouraged yearly  Eye exam. Discussed metformin as an option. Discussed potential S.E. He wants to think about it.   Hypertension- well controlled.  Continue current regimen  Now has a 40 year old and 40 month old.  Marland Kitchen

## 2015-06-16 IMAGING — CR DG CHEST 2V
2 series · 2 of 2 positions shown · non-contrast
Comparison: None

CLINICAL DATA: Coarse lung sounds.

CHEST - 2 VIEW

[view not recorded (1 of 2)]
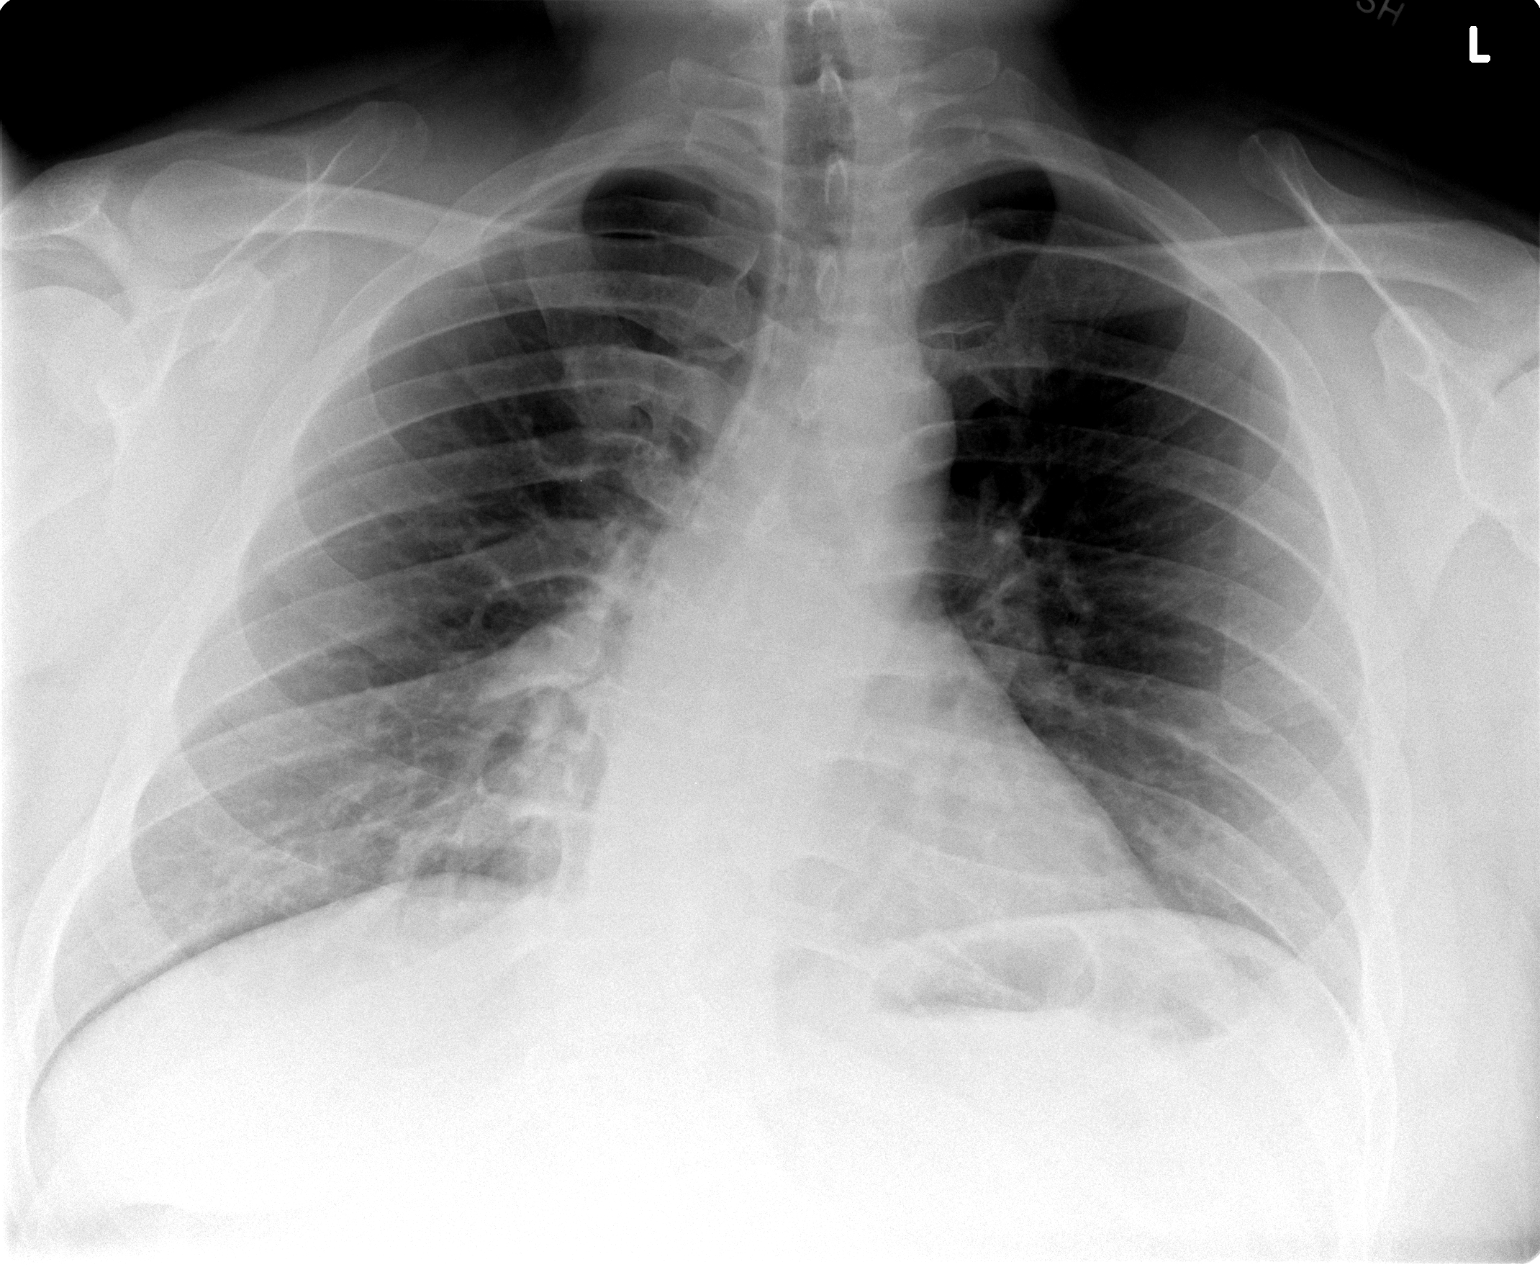

[view not recorded (2 of 2)]
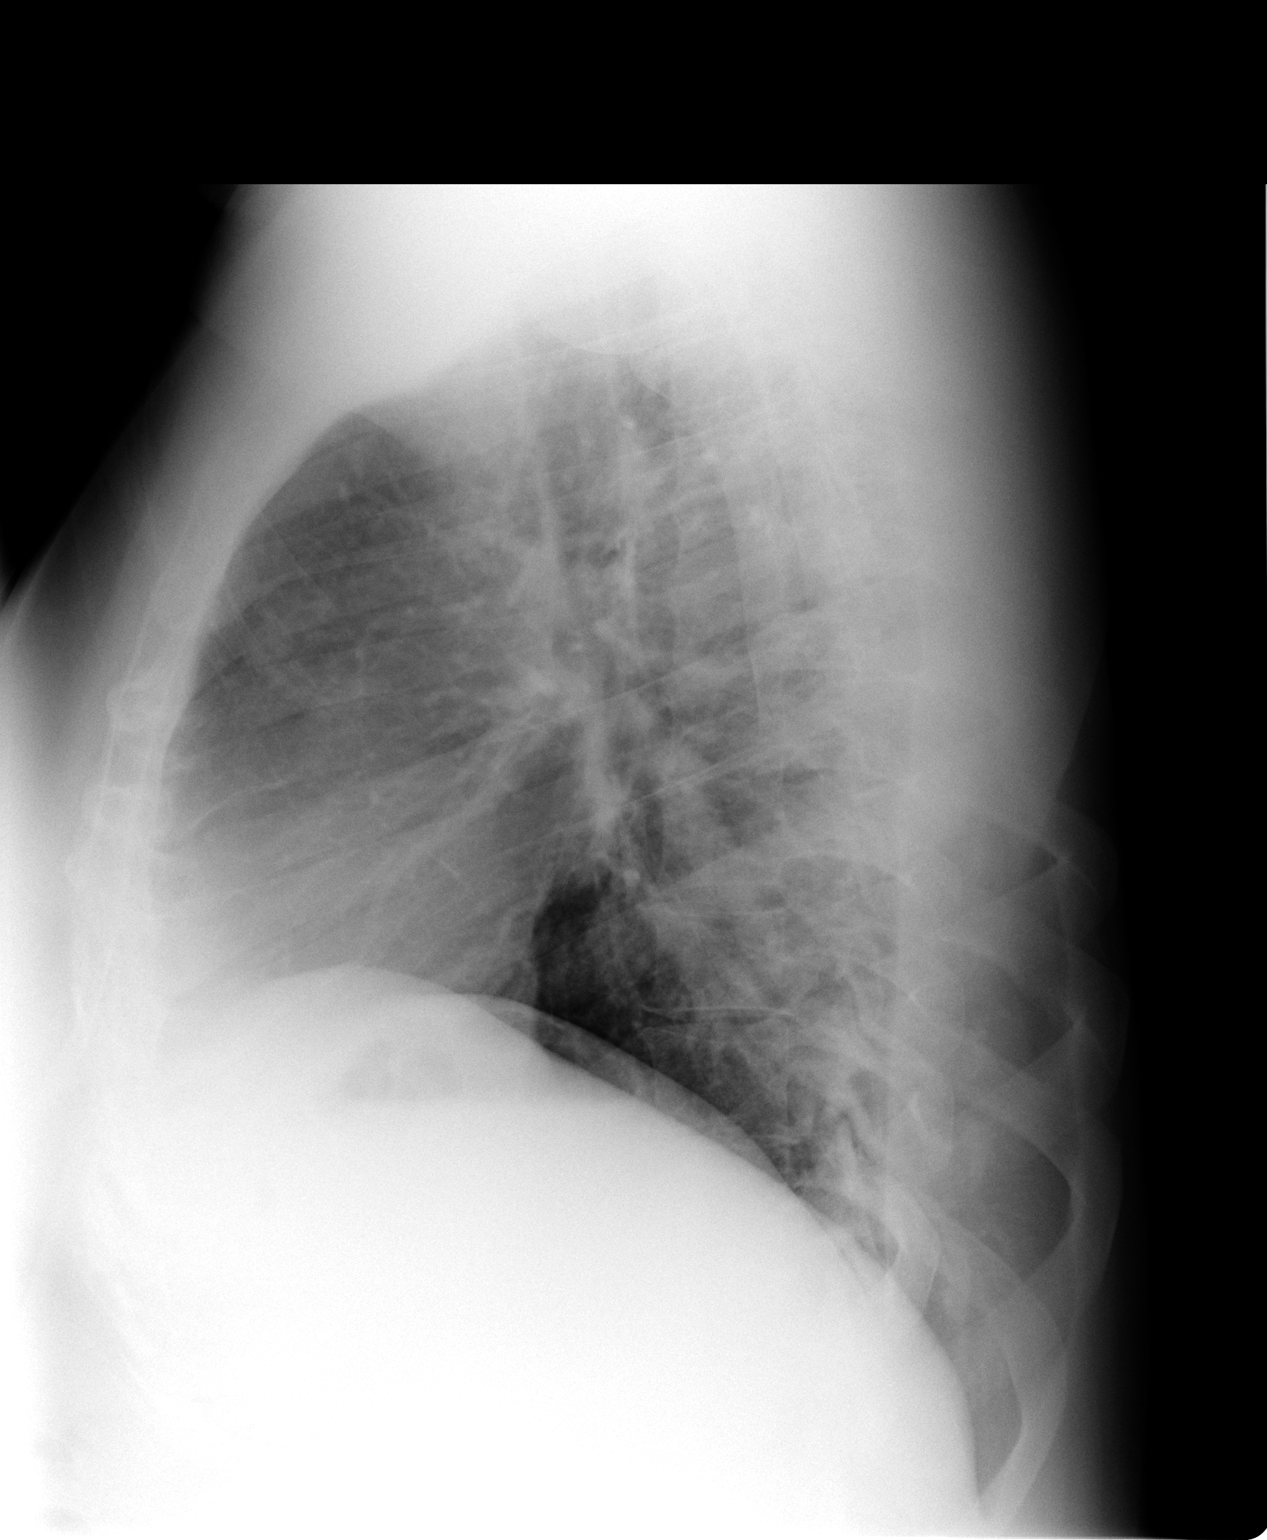

[2 of 2 positions shown; findings below may reference images not displayed]

FINDINGS: The cardiac silhouette, mediastinal and hilar contours
are within normal limits.  The lungs are clear of acute infiltrate,
edema or effusion.  No pulmonary lesions.  The bony thorax is
intact. Significant right convex thoracic scoliosis.
IMPRESSION: No acute cardiopulmonary findings.

## 2015-10-12 ENCOUNTER — Other Ambulatory Visit: Payer: Self-pay

## 2015-10-12 ENCOUNTER — Encounter: Payer: Self-pay | Admitting: Family Medicine

## 2015-10-12 ENCOUNTER — Ambulatory Visit (INDEPENDENT_AMBULATORY_CARE_PROVIDER_SITE_OTHER): Payer: PRIVATE HEALTH INSURANCE | Admitting: Family Medicine

## 2015-10-12 VITALS — BP 113/62 | HR 62 | Wt 283.0 lb

## 2015-10-12 DIAGNOSIS — I10 Essential (primary) hypertension: Secondary | ICD-10-CM | POA: Diagnosis not present

## 2015-10-12 DIAGNOSIS — E119 Type 2 diabetes mellitus without complications: Secondary | ICD-10-CM | POA: Diagnosis not present

## 2015-10-12 DIAGNOSIS — K219 Gastro-esophageal reflux disease without esophagitis: Secondary | ICD-10-CM

## 2015-10-12 LAB — POCT GLYCOSYLATED HEMOGLOBIN (HGB A1C): Hemoglobin A1C: 6

## 2015-10-12 MED ORDER — LISINOPRIL-HYDROCHLOROTHIAZIDE 20-25 MG PO TABS: ORAL_TABLET | ORAL | Status: DC

## 2015-10-12 MED ORDER — RANITIDINE HCL 150 MG PO CAPS: 150.0000 mg | ORAL_CAPSULE | Freq: Two times a day (BID) | ORAL | Status: DC

## 2015-10-12 NOTE — Progress Notes (Signed)
   Subjective:    Patient ID: Chad Lawson, male    DOB: [birth year], 39 y.o.   MRN: [id number]  HPI Diabetes - no hypoglycemic events. No wounds or sores that are not healing well. No increased thirst or urination. Checking glucose at home. Taking medications as prescribed without any side effects.  Hypertension- Pt denies chest pain, SOB, dizziness, or heart palpitations.  Taking meds as directed w/o problems.  Denies medication side effects.    GERD - used to take OTC pills for this. Says the last couple of weeks has had some GERD in his chest.  Says notices it after he eats and worse if has spicey food.    Review of Systems     Objective:   Physical Exam  Constitutional: He is oriented to person, place, and time. He appears well-developed and well-nourished.  HENT:  Head: Normocephalic and atraumatic.  Cardiovascular: Normal rate, regular rhythm and normal heart sounds.   Pulmonary/Chest: Effort normal and breath sounds normal.  Neurological: He is alert and oriented to person, place, and time.  Skin: Skin is warm and dry.  Psychiatric: He has a normal mood and affect. His behavior is normal.          Assessment & Plan:  DM- A1C is 6.0. Well controlled. Continue current regimen. Reminded him Due for eye exam.  Follow-up in 3-4 months.  HTN - well controlled. Continue current regimen. Follow-up in 3 months.  GERD - discussed dietary measures. Will start a PPI. Was on ranitidine 150 mg twice a day. Not improving his symptoms over a week and call back and can switch to a PPI. I think his diet has a lot to do with recent increase in reflux symptoms.

## 2015-10-13 LAB — LIPID PANEL
Cholesterol: 161 mg/dL (ref 125–200)
HDL: 48 mg/dL (ref >=40)
LDL CALC: 105 mg/dL (ref <130)
Total CHOL/HDL Ratio: 3.4 Ratio (ref <=5.0)
Triglycerides: 38 mg/dL (ref <150)
VLDL: 8 mg/dL (ref <30)

## 2015-10-13 LAB — COMPLETE METABOLIC PANEL WITH GFR
ALBUMIN: 4.6 g/dL (ref 3.6–5.1)
ALK PHOS: 38 U/L — AB (ref 40–115)
ALT: 21 U/L (ref 9–46)
AST: 18 U/L (ref 10–40)
BILIRUBIN TOTAL: 0.9 mg/dL (ref 0.2–1.2)
BUN: 14 mg/dL (ref 7–25)
CO2: 28 mmol/L (ref 20–31)
CREATININE: 0.88 mg/dL (ref 0.60–1.35)
Calcium: 9.5 mg/dL (ref 8.6–10.3)
Chloride: 100 mmol/L (ref 98–110)
GFR, Est African American: >89 mL/min (ref >=60)
GLUCOSE: 109 mg/dL — AB (ref 65–99)
Potassium: 4.5 mmol/L (ref 3.5–5.3)
Sodium: 138 mmol/L (ref 135–146)
TOTAL PROTEIN: 7.3 g/dL (ref 6.1–8.1)

## 2016-01-11 ENCOUNTER — Ambulatory Visit: Payer: PRIVATE HEALTH INSURANCE | Admitting: Family Medicine

## 2016-02-01 ENCOUNTER — Ambulatory Visit: Payer: PRIVATE HEALTH INSURANCE | Admitting: Family Medicine

## 2016-02-08 ENCOUNTER — Encounter: Payer: Self-pay | Admitting: Family Medicine

## 2016-02-08 ENCOUNTER — Ambulatory Visit (INDEPENDENT_AMBULATORY_CARE_PROVIDER_SITE_OTHER): Payer: PRIVATE HEALTH INSURANCE | Admitting: Family Medicine

## 2016-02-08 VITALS — BP 117/76 | HR 78 | Wt 286.0 lb

## 2016-02-08 DIAGNOSIS — E119 Type 2 diabetes mellitus without complications: Secondary | ICD-10-CM | POA: Diagnosis not present

## 2016-02-08 DIAGNOSIS — I10 Essential (primary) hypertension: Secondary | ICD-10-CM | POA: Diagnosis not present

## 2016-02-08 LAB — POCT GLYCOSYLATED HEMOGLOBIN (HGB A1C): Hemoglobin A1C: 5.7

## 2016-02-08 MED ORDER — LISINOPRIL-HYDROCHLOROTHIAZIDE 20-12.5 MG PO TABS: 1.0000 | ORAL_TABLET | Freq: Every day | ORAL | Status: DC

## 2016-02-08 MED ORDER — LISINOPRIL-HYDROCHLOROTHIAZIDE 20-25 MG PO TABS: ORAL_TABLET | ORAL | Status: DC

## 2016-02-08 NOTE — Progress Notes (Signed)
   Subjective:    Patient ID: Chad Lawson, male    DOB: Dec 22, 1976, 40 y.o.   MRN: [id number]  HPI Diabetes - no hypoglycemic events. No wounds or sores that are not healing well. No increased thirst or urination. Checking glucose at home. Taking medications as prescribed without any side effects.  Hypertension- Pt denies chest pain, SOB, dizziness, or heart palpitations.  Taking meds as directed w/o problems.  Denies medication side effects.       Review of Systems     Objective:   Physical Exam  Constitutional: He is oriented to person, place, and time. He appears well-developed and well-nourished.  HENT:  Head: Normocephalic and atraumatic.  Cardiovascular: Normal rate, regular rhythm and normal heart sounds.   Pulmonary/Chest: Effort normal and breath sounds normal.  Neurological: He is alert and oriented to person, place, and time.  Skin: Skin is warm and dry.  Psychiatric: He has a normal mood and affect. His behavior is normal.          Assessment & Plan:  Diabetes-well-controlled. Continue current regimen. Looks fantastic. He is currently diet controlled. Follow-up in 4 months. We will be due for a BMP at that time. Reminded him that he is due for his eye exam.  Hypertension-it looks fantastic. We will decrease the hydrochlorothiazide component of his diuretic to 12.5 mg. New perception sent to the pharmacy.

## 2016-06-06 ENCOUNTER — Ambulatory Visit: Payer: PRIVATE HEALTH INSURANCE | Admitting: Family Medicine

## 2016-07-04 ENCOUNTER — Ambulatory Visit: Payer: PRIVATE HEALTH INSURANCE | Admitting: Family Medicine

## 2016-08-26 ENCOUNTER — Other Ambulatory Visit: Payer: Self-pay | Admitting: Family Medicine

## 2016-09-05 ENCOUNTER — Ambulatory Visit: Payer: PRIVATE HEALTH INSURANCE | Admitting: Family Medicine

## 2016-09-05 NOTE — Progress Notes (Deleted)
Subjective:    CC: HTN, DM  HPI:  Hypertension- Pt denies chest pain, SOB, dizziness, or heart palpitations.  Taking meds as directed w/o problems.  Denies medication side effects.    Diabetes - no hypoglycemic events. No wounds or sores that are not healing well. No increased thirst or urination. Checking glucose at home. Taking medications as prescribed without any side effects.   Past medical history, Surgical history, Family history not pertinant except as noted below, Social history, Allergies, and medications have been entered into the medical record, reviewed, and corrections made.   Review of Systems: No fevers, chills, night sweats, weight loss, chest pain, or shortness of breath.   Objective:    General: Well Developed, well nourished, and in no acute distress.  Neuro: Alert and oriented x3, extra-ocular muscles intact, sensation grossly intact.  HEENT: Normocephalic, atraumatic  Skin: Warm and dry, no rashes. Cardiac: Regular rate and rhythm, no murmurs rubs or gallops, no lower extremity edema.  Respiratory: Clear to auscultation bilaterally. Not using accessory muscles, speaking in full sentences.   Impression and Recommendations:    HTN -   DM-

## 2016-10-03 ENCOUNTER — Encounter: Payer: Self-pay | Admitting: Family Medicine

## 2016-10-03 ENCOUNTER — Ambulatory Visit (INDEPENDENT_AMBULATORY_CARE_PROVIDER_SITE_OTHER): Payer: PRIVATE HEALTH INSURANCE | Admitting: Family Medicine

## 2016-10-03 VITALS — BP 127/75 | HR 80 | Ht 76.0 in | Wt 300.0 lb

## 2016-10-03 DIAGNOSIS — E119 Type 2 diabetes mellitus without complications: Secondary | ICD-10-CM | POA: Diagnosis not present

## 2016-10-03 DIAGNOSIS — I10 Essential (primary) hypertension: Secondary | ICD-10-CM | POA: Diagnosis not present

## 2016-10-03 LAB — LIPID PANEL
Cholesterol: 165 mg/dL (ref 125–200)
HDL: 51 mg/dL (ref >=40)
LDL Cholesterol: 102 mg/dL (ref <130)
Total CHOL/HDL Ratio: 3.2 Ratio (ref <=5.0)
Triglycerides: 61 mg/dL (ref <150)
VLDL: 12 mg/dL (ref <30)

## 2016-10-03 LAB — COMPLETE METABOLIC PANEL WITH GFR
ALBUMIN: 4.4 g/dL (ref 3.6–5.1)
ALK PHOS: 33 U/L — AB (ref 40–115)
ALT: 24 U/L (ref 9–46)
AST: 21 U/L (ref 10–40)
BILIRUBIN TOTAL: 0.7 mg/dL (ref 0.2–1.2)
BUN: 12 mg/dL (ref 7–25)
CO2: 24 mmol/L (ref 20–31)
Calcium: 9.3 mg/dL (ref 8.6–10.3)
Chloride: 102 mmol/L (ref 98–110)
Creat: 0.96 mg/dL (ref 0.60–1.35)
Glucose, Bld: 103 mg/dL — ABNORMAL HIGH (ref 65–99)
POTASSIUM: 4.5 mmol/L (ref 3.5–5.3)
Sodium: 138 mmol/L (ref 135–146)
TOTAL PROTEIN: 7.2 g/dL (ref 6.1–8.1)

## 2016-10-03 LAB — POCT UA - MICROALBUMIN
Creatinine, POC: 50 mg/dL
MICROALBUMIN (UR) POC: 10 mg/L

## 2016-10-03 LAB — POCT GLYCOSYLATED HEMOGLOBIN (HGB A1C): HEMOGLOBIN A1C: 5.9

## 2016-10-03 MED ORDER — LISINOPRIL-HYDROCHLOROTHIAZIDE 20-12.5 MG PO TABS: 1.0000 | ORAL_TABLET | Freq: Every day | ORAL | 1 refills | Status: DC

## 2016-10-03 NOTE — Progress Notes (Signed)
Subjective:    CC: DM  HPI: Diabetes - no hypoglycemic events. No wounds or sores that are not healing well. No increased thirst or urination. Checking glucose at home. Taking medications as prescribed without any side effects.  Hypertension- Pt denies chest pain, SOB, dizziness, or heart palpitations.  Taking meds as directed w/o problems.  Denies medication side effects.     Past medical history, Surgical history, Family history not pertinant except as noted below, Social history, Allergies, and medications have been entered into the medical record, reviewed, and corrections made.   Review of Systems: No fevers, chills, night sweats, weight loss, chest pain, or shortness of breath.   Objective:    General: Well Developed, well nourished, and in no acute distress.  Neuro: Alert and oriented x3, extra-ocular muscles intact, sensation grossly intact.  HEENT: Normocephalic, atraumatic  Skin: Warm and dry, no rashes. Cardiac: Regular rate and rhythm, no murmurs rubs or gallops, no lower extremity edema. No carotid bruits Respiratory: Clear to auscultation bilaterally. Not using accessory muscles, speaking in full sentences.   Impression and Recommendations:   DM - A1C is up to 5.9 today.  Did encourage him to get back on track with diet and exercise and weight loss. He's gained about 20 pounds since I last saw him. He's really done fantastic job and being able to control his blood sugars without being on medications are really want to try to keep him there. Follow-up in 6 months.   HTN - Well-controlled. Continue current regimen. Follow-up in 6 months.

## 2017-04-03 ENCOUNTER — Ambulatory Visit: Payer: PRIVATE HEALTH INSURANCE | Admitting: Family Medicine

## 2017-07-17 ENCOUNTER — Other Ambulatory Visit: Payer: Self-pay | Admitting: Family Medicine

## 2017-09-07 LAB — HM DIABETES EYE EXAM

## 2017-09-11 ENCOUNTER — Ambulatory Visit (INDEPENDENT_AMBULATORY_CARE_PROVIDER_SITE_OTHER): Payer: PRIVATE HEALTH INSURANCE | Admitting: Family Medicine

## 2017-09-11 VITALS — BP 137/89 | HR 76 | Wt 307.0 lb

## 2017-09-11 DIAGNOSIS — E119 Type 2 diabetes mellitus without complications: Secondary | ICD-10-CM

## 2017-09-11 DIAGNOSIS — I10 Essential (primary) hypertension: Secondary | ICD-10-CM

## 2017-09-11 LAB — POCT GLYCOSYLATED HEMOGLOBIN (HGB A1C): HEMOGLOBIN A1C: 6.1

## 2017-09-11 MED ORDER — LISINOPRIL-HYDROCHLOROTHIAZIDE 20-12.5 MG PO TABS: 1.0000 | ORAL_TABLET | Freq: Every day | ORAL | 2 refills | Status: DC

## 2017-09-11 NOTE — Progress Notes (Signed)
Subjective:    CC: DM, HTN  HPI:  Diabetes - no hypoglycemic events. No wounds or sores that are not healing well. No increased thirst or urination. Checking glucose at home. Taking medications as prescribed without any side effects.  Hypertension- Pt denies chest pain, SOB, dizziness, or heart palpitations.  Taking meds as directed w/o problems.  Denies medication side effects.     Past medical history, Surgical history, Family history not pertinant except as noted below, Social history, Allergies, and medications have been entered into the medical record, reviewed, and corrections made.   Review of Systems: No fevers, chills, night sweats, weight loss, chest pain, or shortness of breath.   Objective:    General: Well Developed, well nourished, and in no acute distress.  Neuro: Alert and oriented x3, extra-ocular muscles intact, sensation grossly intact.  HEENT: Normocephalic, atraumatic  Skin: Warm and dry, no rashes. Cardiac: Regular rate and rhythm, no murmurs rubs or gallops, no lower extremity edema.  Respiratory: Clear to auscultation bilaterally. Not using accessory muscles, speaking in full sentences.   Impression and Recommendations:    DM - Well controlled. Continue current regimen. Follow up in  4 months since A1C went up sliglhtly. Work on diet and exercise. Will call for recent eye exam. Labslip given.  Lab Results  Component Value Date   HGBA1C 6.1 09/11/2017     HTN - Well controlled. Continue current regimen. Follow up in  6 months.

## 2018-01-15 ENCOUNTER — Ambulatory Visit: Payer: PRIVATE HEALTH INSURANCE | Admitting: Family Medicine

## 2018-01-29 ENCOUNTER — Encounter: Payer: Self-pay | Admitting: Family Medicine

## 2019-01-11 ENCOUNTER — Other Ambulatory Visit: Payer: Self-pay | Admitting: Family Medicine

## 2019-01-16 ENCOUNTER — Other Ambulatory Visit: Payer: Self-pay

## 2019-01-16 MED ORDER — BLOOD GLUCOSE METER KIT: PACK | 0 refills | Status: AC

## 2019-01-24 ENCOUNTER — Telehealth: Payer: Self-pay

## 2019-01-24 NOTE — Telephone Encounter (Signed)
Test strip, meter and lancets.

## 2019-02-04 ENCOUNTER — Encounter: Payer: Self-pay | Admitting: Family Medicine

## 2019-02-04 ENCOUNTER — Telehealth: Payer: Self-pay | Admitting: Family Medicine

## 2019-02-04 ENCOUNTER — Ambulatory Visit (INDEPENDENT_AMBULATORY_CARE_PROVIDER_SITE_OTHER): Payer: Commercial Managed Care - PPO | Admitting: Family Medicine

## 2019-02-04 VITALS — BP 133/87 | HR 84 | Ht 76.0 in | Wt 295.0 lb

## 2019-02-04 DIAGNOSIS — I10 Essential (primary) hypertension: Secondary | ICD-10-CM | POA: Diagnosis not present

## 2019-02-04 DIAGNOSIS — E1169 Type 2 diabetes mellitus with other specified complication: Secondary | ICD-10-CM | POA: Diagnosis not present

## 2019-02-04 LAB — POCT GLYCOSYLATED HEMOGLOBIN (HGB A1C): Hemoglobin A1C: 12.8 % — AB (ref 4.0–5.6)

## 2019-02-04 MED ORDER — ATORVASTATIN CALCIUM 20 MG PO TABS: 20.0000 mg | ORAL_TABLET | Freq: Every day | ORAL | 3 refills | Status: DC

## 2019-02-04 NOTE — Telephone Encounter (Signed)
Please call patient and let him know that I also sent over prescription for low-dose statin to take at bedtime.  This helps reduce his risk of complications such as heart attack and stroke from his blood sugar levels.  This is recommended for all diabetics irregardless of blood levels.

## 2019-02-04 NOTE — Progress Notes (Signed)
Subjective:    CC: DM, HTN  HPI:  Diabetes - no hypoglycemic events. No wounds or sores that are not healing well. He is currently on prescription medication.  His last A1c was actually 6.1 but it was a little over a year ago.  He says more recently at work he had a health screening and was told that his glucose was around 300.  He says since then about 2 weeks ago he is really dramatically changed his diet he says he was noticing that he was urinating frequently and even having to get up in the middle the night to urinate since changing his diet that actually seems to have resolved.  He does not exercise regularly.  Not had an eye exam in the last year.  Hypertension- Pt denies chest pain, SOB, dizziness, or heart palpitations.  Taking meds as directed w/o problems.  Denies medication side effects.  Took BP med later than he usually does.    Past medical history, Surgical history, Family history not pertinant except as noted below, Social history, Allergies, and medications have been entered into the medical record, reviewed, and corrections made.   Review of Systems: No fevers, chills, night sweats, weight loss, chest pain, or shortness of breath.   Objective:    General: Well Developed, well nourished, and in no acute distress.  Neuro: Alert and oriented x3, extra-ocular muscles intact, sensation grossly intact.  HEENT: Normocephalic, atraumatic  Skin: Warm and dry, no rashes. Cardiac: Regular rate and rhythm, no murmurs rubs or gallops, no lower extremity edema.  Respiratory: Clear to auscultation bilaterally. Not using accessory muscles, speaking in full sentences.   Impression and Recommendations:    HTN - Well controlled. Continue current regimen. Follow up in  3-4 months.    DM - Uncontrolled.  Discussed A1C salts today.  He feels like a lot of it is dietary related.  We discussed the importance of getting back on track, regular exercise and weight loss.  He is committed to  making those changes so I encouraged him to check his blood sugar at home over the next couple of weeks.  Prescription has been sent for glucometer.  And then call us with those numbers.  If his sugars are not consistently under 130 at that time then we will need to start medication.Marland Kitchen

## 2019-02-04 NOTE — Telephone Encounter (Signed)
Pt advised and agreeable to plan.Maryruth Eve, Lahoma Crocker, CMA

## 2019-02-04 NOTE — Patient Instructions (Signed)
Send [place] a note or call and let me know what your sugars are. Try to check about 3-4 times a week. Call with numbers in about 2 weeks.

## 2019-02-05 LAB — COMPLETE METABOLIC PANEL WITH GFR
AG Ratio: 1.6 (calc) (ref 1.0–2.5)
ALT: 22 U/L (ref 9–46)
AST: 16 U/L (ref 10–40)
Albumin: 4.6 g/dL (ref 3.6–5.1)
Alkaline phosphatase (APISO): 52 U/L (ref 36–130)
BUN: 10 mg/dL (ref 7–25)
CALCIUM: 10 mg/dL (ref 8.6–10.3)
CHLORIDE: 100 mmol/L (ref 98–110)
CO2: 28 mmol/L (ref 20–32)
CREATININE: 0.93 mg/dL (ref 0.60–1.35)
GFR, EST AFRICAN AMERICAN: 116 mL/min/{1.73_m2} (ref >=60)
GFR, Est Non African American: 100 mL/min/{1.73_m2} (ref >=60)
GLUCOSE: 239 mg/dL — AB (ref 65–99)
Globulin: 2.9 g/dL (calc) (ref 1.9–3.7)
Potassium: 4.2 mmol/L (ref 3.5–5.3)
Sodium: 138 mmol/L (ref 135–146)
TOTAL PROTEIN: 7.5 g/dL (ref 6.1–8.1)
Total Bilirubin: 1 mg/dL (ref 0.2–1.2)

## 2019-02-05 LAB — LIPID PANEL
CHOL/HDL RATIO: 3.9 (calc) (ref <5.0)
Cholesterol: 185 mg/dL (ref <200)
HDL: 47 mg/dL (ref >=40)
LDL CHOLESTEROL (CALC): 123 mg/dL — AB
Non-HDL Cholesterol (Calc): 138 mg/dL (calc) — ABNORMAL HIGH (ref <130)
Triglycerides: 63 mg/dL (ref <150)

## 2019-02-05 LAB — CBC
HCT: 48 % (ref 38.5–50.0)
Hemoglobin: 15.9 g/dL (ref 13.2–17.1)
MCH: 28.8 pg (ref 27.0–33.0)
MCHC: 33.1 g/dL (ref 32.0–36.0)
MCV: 86.8 fL (ref 80.0–100.0)
MPV: 10 fL (ref 7.5–12.5)
PLATELETS: 249 10*3/uL (ref 140–400)
RBC: 5.53 10*6/uL (ref 4.20–5.80)
RDW: 12 % (ref 11.0–15.0)
WBC: 3.1 10*3/uL — AB (ref 3.8–10.8)

## 2019-02-28 ENCOUNTER — Other Ambulatory Visit: Payer: Self-pay | Admitting: Family Medicine

## 2019-02-28 DIAGNOSIS — I10 Essential (primary) hypertension: Secondary | ICD-10-CM

## 2019-05-03 ENCOUNTER — Other Ambulatory Visit: Payer: Self-pay | Admitting: Family Medicine

## 2019-05-03 DIAGNOSIS — I10 Essential (primary) hypertension: Secondary | ICD-10-CM

## 2019-05-13 ENCOUNTER — Ambulatory Visit (INDEPENDENT_AMBULATORY_CARE_PROVIDER_SITE_OTHER): Payer: Commercial Managed Care - PPO | Admitting: Family Medicine

## 2019-05-13 ENCOUNTER — Encounter: Payer: Self-pay | Admitting: Family Medicine

## 2019-05-13 VITALS — BP 135/87 | HR 92 | Ht 76.0 in | Wt 291.0 lb

## 2019-05-13 DIAGNOSIS — I10 Essential (primary) hypertension: Secondary | ICD-10-CM

## 2019-05-13 DIAGNOSIS — E1169 Type 2 diabetes mellitus with other specified complication: Secondary | ICD-10-CM | POA: Diagnosis not present

## 2019-05-13 LAB — POCT UA - MICROALBUMIN
Albumin/Creatinine Ratio, Urine, POC: <30
Creatinine, POC: 300 mg/dL
Microalbumin Ur, POC: 10 mg/L

## 2019-05-13 LAB — POCT GLYCOSYLATED HEMOGLOBIN (HGB A1C): Hemoglobin A1C: 7.4 % — AB (ref 4.0–5.6)

## 2019-05-13 MED ORDER — METFORMIN HCL ER 500 MG PO TB24: 500.0000 mg | ORAL_TABLET | Freq: Every day | ORAL | 1 refills | Status: DC

## 2019-05-13 NOTE — Progress Notes (Signed)
Established Patient Office Visit  Subjective:  Patient ID: Chad Lawson, male    DOB: 11-12-1976  Age: 43 y.o. MRN: [id number]  CC: No chief complaint on file.   HPI Chad Lawson presents for DM f/u  Diabetes - no hypoglycemic events. No wounds or sores that are not healing well. No increased thirst or urination. Checking glucose at home. Taking medications as prescribed without any side effects.  Hypertension- Pt denies chest pain, SOB, dizziness, or heart palpitations.  Taking meds as directed w/o problems.  Denies medication side effects.      Past Medical History:  Diagnosis Date  . GERD (gastroesophageal reflux disease)   . QZESPQZR(007.6)     Past Surgical History:  Procedure Laterality Date  . NO PAST SURGERIES      Family History  Problem Relation Age of Onset  . Hypertension Mother   . Diabetes Mother        severe diabetic retinopathy w/blindness  . Cancer Mother        breast w/mastectomy  . Stroke Father   . Heart disease Father        deceased in 2002-03-16  . Diabetes Father   . Hypertension Father   . Cancer Maternal Grandmother        uterine  . Cancer Maternal Grandfather        lung cancer    Social History   Socioeconomic History  . Marital status: Married    Spouse name: Not on file  . Number of children: 2  . Years of education: Not on file  . Highest education level: Not on file  Occupational History  . Not on file  Social Needs  . Financial resource strain: Not on file  . Food insecurity:    Worry: Not on file    Inability: Not on file  . Transportation needs:    Medical: Not on file    Non-medical: Not on file  Tobacco Use  . Smoking status: Never Smoker  . Smokeless tobacco: Never Used  Substance and Sexual Activity  . Alcohol use: No  . Drug use: No  . Sexual activity: Yes  Lifestyle  . Physical activity:    Days per week: Not on file    Minutes per session: Not on file  . Stress: Not on file  Relationships   . Social connections:    Talks on phone: Not on file    Gets together: Not on file    Attends religious service: Not on file    Active member of club or organization: Not on file    Attends meetings of clubs or organizations: Not on file    Relationship status: Not on file  . Intimate partner violence:    Fear of current or ex partner: Not on file    Emotionally abused: Not on file    Physically abused: Not on file    Forced sexual activity: Not on file  Other Topics Concern  . Not on file  Social History Narrative   Plays basket ball for fun.      Outpatient Medications Prior to Visit  Medication Sig Dispense Refill  . atorvastatin (LIPITOR) 20 MG tablet Take 1 tablet (20 mg total) by mouth at bedtime. 90 tablet 3  . blood glucose meter kit and supplies Check fasting blood sugar daily and once a week 2 hours after largest meal. Elevated blood sugar. 1 each 0  . lisinopril-hydrochlorothiazide (ZESTORETIC) 20-12.5 MG tablet Take 1  tablet by mouth once daily 30 tablet 0   No facility-administered medications prior to visit.     No Known Allergies  ROS Review of Systems    Objective:    Physical Exam  BP 135/87   Pulse 92   Ht 6' 4"  (1.93 m)   Wt 291 lb (132 kg)   SpO2 100%   BMI 35.42 kg/m  Wt Readings from Last 3 Encounters:  05/13/19 291 lb (132 kg)  02/04/19 295 lb (133.8 kg)  09/11/17 (!) 307 lb (139.3 kg)     Health Maintenance Due  Topic Date Due  . HIV Screening  07/25/1990  . OPHTHALMOLOGY EXAM  09/07/2018    There are no preventive care reminders to display for this patient.  Lab Results  Component Value Date   TSH 0.740 06/19/2008   Lab Results  Component Value Date   WBC 3.1 (L) 02/04/2019   HGB 15.9 02/04/2019   HCT 48.0 02/04/2019   MCV 86.8 02/04/2019   PLT 249 02/04/2019   Lab Results  Component Value Date   NA 138 02/04/2019   K 4.2 02/04/2019   CO2 28 02/04/2019   GLUCOSE 239 (H) 02/04/2019   BUN 10 02/04/2019   CREATININE  0.93 02/04/2019   BILITOT 1.0 02/04/2019   ALKPHOS 33 (L) 10/03/2016   AST 16 02/04/2019   ALT 22 02/04/2019   PROT 7.5 02/04/2019   ALBUMIN 4.4 10/03/2016   CALCIUM 10.0 02/04/2019   Lab Results  Component Value Date   CHOL 185 02/04/2019   Lab Results  Component Value Date   HDL 47 02/04/2019   Lab Results  Component Value Date   LDLCALC 123 (H) 02/04/2019   Lab Results  Component Value Date   TRIG 63 02/04/2019   Lab Results  Component Value Date   CHOLHDL 3.9 02/04/2019   Lab Results  Component Value Date   HGBA1C 7.4 (A) 05/13/2019      Assessment & Plan:   Problem List Items Addressed This Visit      Cardiovascular and Mediastinum   HYPERTENSION, BENIGN SYSTEMIC    Well-controlled on current regimen.  Follow-up in 6 months.        Endocrine   Type 2 diabetes mellitus ([place]) - Primary    He has made significant improvement in his A1c with just diet and exercise alone.  I still think we need to add a medication to get his A1c back into the 6 range so we will start with low-dose metformin.  Warned about potential side effects.  If otherwise doing well follow-up in 3 months if not please feel free to call our office that we can make adjustments to medication regimen.  He is on a statin and ACE inhibitor.      Relevant Medications   metFORMIN (GLUCOPHAGE-XR) 500 MG 24 hr tablet   Other Relevant Orders   POCT glycosylated hemoglobin (Hb A1C) (Completed)   POCT UA - Microalbumin (Completed)       Meds ordered this encounter  Medications  . metFORMIN (GLUCOPHAGE-XR) 500 MG 24 hr tablet    Sig: Take 1 tablet (500 mg total) by mouth daily with breakfast.    Dispense:  90 tablet    Refill:  1    Follow-up: Return in about 3 months (around 08/13/2019) for DM/BP.    Beatrice Lecher, MD

## 2019-05-13 NOTE — Assessment & Plan Note (Signed)
Well-controlled on current regimen.  Follow-up in 6 months.

## 2019-05-13 NOTE — Assessment & Plan Note (Signed)
He has made significant improvement in his A1c with just diet and exercise alone.  I still think we need to add a medication to get his A1c back into the 6 range so we will start with low-dose metformin.  Warned about potential side effects.  If otherwise doing well follow-up in 3 months if not please feel free to call our office that we can make adjustments to medication regimen.  He is on a statin and ACE inhibitor.

## 2019-06-03 ENCOUNTER — Other Ambulatory Visit: Payer: Self-pay | Admitting: Family Medicine

## 2019-06-03 DIAGNOSIS — I10 Essential (primary) hypertension: Secondary | ICD-10-CM

## 2019-08-09 ENCOUNTER — Other Ambulatory Visit: Payer: Self-pay | Admitting: Family Medicine

## 2019-08-19 ENCOUNTER — Encounter: Payer: Self-pay | Admitting: Family Medicine

## 2019-08-19 ENCOUNTER — Ambulatory Visit (INDEPENDENT_AMBULATORY_CARE_PROVIDER_SITE_OTHER): Payer: Commercial Managed Care - PPO | Admitting: Family Medicine

## 2019-08-19 ENCOUNTER — Other Ambulatory Visit: Payer: Self-pay

## 2019-08-19 VITALS — BP 141/88 | HR 77 | Ht 76.0 in | Wt 290.0 lb

## 2019-08-19 DIAGNOSIS — I10 Essential (primary) hypertension: Secondary | ICD-10-CM | POA: Diagnosis not present

## 2019-08-19 DIAGNOSIS — E1169 Type 2 diabetes mellitus with other specified complication: Secondary | ICD-10-CM

## 2019-08-19 LAB — POCT GLYCOSYLATED HEMOGLOBIN (HGB A1C): Hemoglobin A1C: 7 % — AB (ref 4.0–5.6)

## 2019-08-19 NOTE — Assessment & Plan Note (Signed)
BP up just a little bit today but plan to recheck.  We will continue with current regimen.  Labs are up-to-date.  Follow-up in 3 to 4 months.

## 2019-08-19 NOTE — Progress Notes (Signed)
Established Patient Office Visit  Subjective:  Patient ID: Chad Lawson, male    DOB: 1976-07-05  Age: 43 y.o. MRN: [id number]  CC:  Chief Complaint  Patient presents with  . Diabetes    HPI DEVONDRE GUZZETTA presents for   Diabetes - no hypoglycemic events. No wounds or sores that are not healing well. No increased thirst or urination. Checking glucose at home. Taking medications as prescribed without any side effects.  Says he has not been as active as he would like.  He has not scheduled his eye exam yet.  Hypertension- Pt denies chest pain, SOB, dizziness, or heart palpitations.  Taking meds as directed w/o problems.  Denies medication side effects.      Past Medical History:  Diagnosis Date  . GERD (gastroesophageal reflux disease)   . EUMPNTIR(443.1)     Past Surgical History:  Procedure Laterality Date  . NO PAST SURGERIES      Family History  Problem Relation Age of Onset  . Hypertension Mother   . Diabetes Mother        severe diabetic retinopathy w/blindness  . Cancer Mother        breast w/mastectomy  . Stroke Father   . Heart disease Father        deceased in 02-13-2002  . Diabetes Father   . Hypertension Father   . Cancer Maternal Grandmother        uterine  . Cancer Maternal Grandfather        lung cancer    Social History   Socioeconomic History  . Marital status: Married    Spouse name: Not on file  . Number of children: 2  . Years of education: Not on file  . Highest education level: Not on file  Occupational History  . Not on file  Social Needs  . Financial resource strain: Not on file  . Food insecurity    Worry: Not on file    Inability: Not on file  . Transportation needs    Medical: Not on file    Non-medical: Not on file  Tobacco Use  . Smoking status: Never Smoker  . Smokeless tobacco: Never Used  Substance and Sexual Activity  . Alcohol use: No  . Drug use: No  . Sexual activity: Yes  Lifestyle  . Physical activity     Days per week: Not on file    Minutes per session: Not on file  . Stress: Not on file  Relationships  . Social Herbalist on phone: Not on file    Gets together: Not on file    Attends religious service: Not on file    Active member of club or organization: Not on file    Attends meetings of clubs or organizations: Not on file    Relationship status: Not on file  . Intimate partner violence    Fear of current or ex partner: Not on file    Emotionally abused: Not on file    Physically abused: Not on file    Forced sexual activity: Not on file  Other Topics Concern  . Not on file  Social History Narrative   Plays basket ball for fun.      Outpatient Medications Prior to Visit  Medication Sig Dispense Refill  . ACCU-CHEK GUIDE test strip USE 1 STRIP ONCE DAILY AND ONCE A WEEK 2 HOURS AFTER LARGEST MEAL 100 each 0  . atorvastatin (LIPITOR) 20 MG tablet Take  1 tablet (20 mg total) by mouth at bedtime. 90 tablet 3  . blood glucose meter kit and supplies Check fasting blood sugar daily and once a week 2 hours after largest meal. Elevated blood sugar. 1 each 0  . lisinopril-hydrochlorothiazide (ZESTORETIC) 20-12.5 MG tablet Take 1 tablet by mouth once daily 90 tablet 1  . metFORMIN (GLUCOPHAGE-XR) 500 MG 24 hr tablet Take 1 tablet (500 mg total) by mouth daily with breakfast. 90 tablet 1   No facility-administered medications prior to visit.     No Known Allergies  ROS Review of Systems    Objective:    Physical Exam  Constitutional: He is oriented to person, place, and time. He appears well-developed and well-nourished.  HENT:  Head: Normocephalic and atraumatic.  Cardiovascular: Normal rate, regular rhythm and normal heart sounds.  Pulmonary/Chest: Effort normal and breath sounds normal.  Neurological: He is alert and oriented to person, place, and time.  Skin: Skin is warm and dry.  Psychiatric: He has a normal mood and affect. His behavior is normal.     BP (!) 141/88   Pulse 77   Ht _0  (1.93 m)   Wt 290 lb (131.5 kg)   SpO2 100%   BMI 35.30 kg/m  Wt Readings from Last 3 Encounters:  08/19/19 290 lb (131.5 kg)  05/13/19 291 lb (132 kg)  02/04/19 295 lb (133.8 kg)     Health Maintenance Due  Topic Date Due  . HIV Screening  07/25/1990  . OPHTHALMOLOGY EXAM  09/07/2018  . INFLUENZA VACCINE  07/27/2019    There are no preventive care reminders to display for this patient.  Lab Results  Component Value Date   TSH 0.740 06/19/2008   Lab Results  Component Value Date   WBC 3.1 (L) 02/04/2019   HGB 15.9 02/04/2019   HCT 48.0 02/04/2019   MCV 86.8 02/04/2019   PLT 249 02/04/2019   Lab Results  Component Value Date   NA 138 02/04/2019   K 4.2 02/04/2019   CO2 28 02/04/2019   GLUCOSE 239 (H) 02/04/2019   BUN 10 02/04/2019   CREATININE 0.93 02/04/2019   BILITOT 1.0 02/04/2019   ALKPHOS 33 (L) 10/03/2016   AST 16 02/04/2019   ALT 22 02/04/2019   PROT 7.5 02/04/2019   ALBUMIN 4.4 10/03/2016   CALCIUM 10.0 02/04/2019   Lab Results  Component Value Date   CHOL 185 02/04/2019   Lab Results  Component Value Date   HDL 47 02/04/2019   Lab Results  Component Value Date   LDLCALC 123 (H) 02/04/2019   Lab Results  Component Value Date   TRIG 63 02/04/2019   Lab Results  Component Value Date   CHOLHDL 3.9 02/04/2019   Lab Results  Component Value Date   HGBA1C 7.0 (A) 08/19/2019      Assessment & Plan:   Problem List Items Addressed This Visit      Cardiovascular and Mediastinum   HYPERTENSION, BENIGN SYSTEMIC - Primary    BP up just a little bit today but plan to recheck.  We will continue with current regimen.  Labs are up-to-date.  Follow-up in 3 to 4 months.        Endocrine   Type 2 diabetes mellitus ([place])    Improved.  A1c down to 7.0.  He has not been as active so just encouraged him to get back into his exercise routine continue to make the changes that he has been working on.  Follow-up in 3 to 4 months.  I think at that point we should be able to get his A1c under 7.      Relevant Orders   POCT glycosylated hemoglobin (Hb A1C) (Completed)     Reminded him to please schedule his eye exam.  No orders of the defined types were placed in this encounter.   Follow-up: Return in about 3 months (around 11/19/2019) for Diabetes follow-up.    Beatrice Lecher, MD

## 2019-08-19 NOTE — Assessment & Plan Note (Signed)
Improved.  A1c down to 7.0.  He has not been as active so just encouraged him to get back into his exercise routine continue to make the changes that he has been working on.  Follow-up in 3 to 4 months.  I think at that point we should be able to get his A1c under 7.

## 2019-09-13 ENCOUNTER — Other Ambulatory Visit: Payer: Self-pay | Admitting: Family Medicine

## 2019-11-25 ENCOUNTER — Ambulatory Visit: Payer: PRIVATE HEALTH INSURANCE | Admitting: Family Medicine

## 2019-11-25 NOTE — Progress Notes (Deleted)
Established Patient Office Visit  Subjective:  Patient ID: Chad Lawson, male    DOB: December 14, 1976  Age: 43 y.o. MRN: [id number]  CC: No chief complaint on file.   HPI KAYLEB WARSHAW presents for   Diabetes - no hypoglycemic events. No wounds or sores that are not healing well. No increased thirst or urination. Checking glucose at home. Taking medications as prescribed without any side effects.  Hypertension- Pt denies chest pain, SOB, dizziness, or heart palpitations.  Taking meds as directed w/o problems.  Denies medication side effects.      Past Medical History:  Diagnosis Date  . GERD (gastroesophageal reflux disease)   . WCBJSEGB(151.7)     Past Surgical History:  Procedure Laterality Date  . NO PAST SURGERIES      Family History  Problem Relation Age of Onset  . Hypertension Mother   . Diabetes Mother        severe diabetic retinopathy w/blindness  . Cancer Mother        breast w/mastectomy  . Stroke Father   . Heart disease Father        deceased in March 02, 2002  . Diabetes Father   . Hypertension Father   . Cancer Maternal Grandmother        uterine  . Cancer Maternal Grandfather        lung cancer    Social History   Socioeconomic History  . Marital status: Married    Spouse name: Not on file  . Number of children: 2  . Years of education: Not on file  . Highest education level: Not on file  Occupational History  . Not on file  Social Needs  . Financial resource strain: Not on file  . Food insecurity    Worry: Not on file    Inability: Not on file  . Transportation needs    Medical: Not on file    Non-medical: Not on file  Tobacco Use  . Smoking status: Never Smoker  . Smokeless tobacco: Never Used  Substance and Sexual Activity  . Alcohol use: No  . Drug use: No  . Sexual activity: Yes  Lifestyle  . Physical activity    Days per week: Not on file    Minutes per session: Not on file  . Stress: Not on file  Relationships  . Social  Herbalist on phone: Not on file    Gets together: Not on file    Attends religious service: Not on file    Active member of club or organization: Not on file    Attends meetings of clubs or organizations: Not on file    Relationship status: Not on file  . Intimate partner violence    Fear of current or ex partner: Not on file    Emotionally abused: Not on file    Physically abused: Not on file    Forced sexual activity: Not on file  Other Topics Concern  . Not on file  Social History Narrative   Plays basket ball for fun.      Outpatient Medications Prior to Visit  Medication Sig Dispense Refill  . ACCU-CHEK GUIDE test strip USE 1 STRIP ONCE DAILY AND ONCE A WEEK 2 HOURS AFTER LARGEST MEAL 100 each 0  . atorvastatin (LIPITOR) 20 MG tablet Take 1 tablet (20 mg total) by mouth at bedtime. 90 tablet 3  . blood glucose meter kit and supplies Check fasting blood sugar daily and once  a week 2 hours after largest meal. Elevated blood sugar. 1 each 0  . lisinopril-hydrochlorothiazide (ZESTORETIC) 20-12.5 MG tablet Take 1 tablet by mouth once daily 90 tablet 1  . metFORMIN (GLUCOPHAGE-XR) 500 MG 24 hr tablet Take 1 tablet by mouth once daily with breakfast 90 tablet 3   No facility-administered medications prior to visit.     No Known Allergies  ROS Review of Systems    Objective:    Physical Exam  There were no vitals taken for this visit. Wt Readings from Last 3 Encounters:  08/19/19 290 lb (131.5 kg)  05/13/19 291 lb (132 kg)  02/04/19 295 lb (133.8 kg)     Health Maintenance Due  Topic Date Due  . HIV Screening  07/25/1990  . OPHTHALMOLOGY EXAM  09/07/2018    There are no preventive care reminders to display for this patient.  Lab Results  Component Value Date   TSH 0.740 06/19/2008   Lab Results  Component Value Date   WBC 3.1 (L) 02/04/2019   HGB 15.9 02/04/2019   HCT 48.0 02/04/2019   MCV 86.8 02/04/2019   PLT 249 02/04/2019   Lab Results   Component Value Date   NA 138 02/04/2019   K 4.2 02/04/2019   CO2 28 02/04/2019   GLUCOSE 239 (H) 02/04/2019   BUN 10 02/04/2019   CREATININE 0.93 02/04/2019   BILITOT 1.0 02/04/2019   ALKPHOS 33 (L) 10/03/2016   AST 16 02/04/2019   ALT 22 02/04/2019   PROT 7.5 02/04/2019   ALBUMIN 4.4 10/03/2016   CALCIUM 10.0 02/04/2019   Lab Results  Component Value Date   CHOL 185 02/04/2019   Lab Results  Component Value Date   HDL 47 02/04/2019   Lab Results  Component Value Date   LDLCALC 123 (H) 02/04/2019   Lab Results  Component Value Date   TRIG 63 02/04/2019   Lab Results  Component Value Date   CHOLHDL 3.9 02/04/2019   Lab Results  Component Value Date   HGBA1C 7.0 (A) 08/19/2019      Assessment & Plan:   Problem List Items Addressed This Visit      Cardiovascular and Mediastinum   HYPERTENSION, BENIGN SYSTEMIC     Endocrine   Type 2 diabetes mellitus ([place]) - Primary      No orders of the defined types were placed in this encounter.   Follow-up: No follow-ups on file.    Beatrice Lecher, MD

## 2019-12-04 ENCOUNTER — Other Ambulatory Visit: Payer: Self-pay | Admitting: Family Medicine

## 2019-12-04 DIAGNOSIS — I10 Essential (primary) hypertension: Secondary | ICD-10-CM

## 2019-12-10 ENCOUNTER — Other Ambulatory Visit: Payer: Self-pay | Admitting: Family Medicine

## 2019-12-11 ENCOUNTER — Other Ambulatory Visit: Payer: Self-pay | Admitting: Family Medicine

## 2019-12-11 NOTE — Telephone Encounter (Signed)
Refill sent to pharmacy. Pt aware via phone call.

## 2020-01-03 ENCOUNTER — Other Ambulatory Visit: Payer: Self-pay | Admitting: Family Medicine

## 2020-01-03 DIAGNOSIS — I10 Essential (primary) hypertension: Secondary | ICD-10-CM

## 2020-01-03 NOTE — Telephone Encounter (Signed)
No more refills until appointment made

## 2020-01-06 ENCOUNTER — Ambulatory Visit (INDEPENDENT_AMBULATORY_CARE_PROVIDER_SITE_OTHER): Payer: Commercial Managed Care - PPO | Admitting: Family Medicine

## 2020-01-06 ENCOUNTER — Other Ambulatory Visit: Payer: Self-pay

## 2020-01-06 ENCOUNTER — Encounter: Payer: Self-pay | Admitting: Family Medicine

## 2020-01-06 VITALS — BP 134/87 | HR 91 | Ht 76.0 in | Wt 288.0 lb

## 2020-01-06 DIAGNOSIS — E119 Type 2 diabetes mellitus without complications: Secondary | ICD-10-CM | POA: Diagnosis not present

## 2020-01-06 DIAGNOSIS — I10 Essential (primary) hypertension: Secondary | ICD-10-CM | POA: Diagnosis not present

## 2020-01-06 LAB — POCT GLYCOSYLATED HEMOGLOBIN (HGB A1C): Hemoglobin A1C: 7 % — AB (ref 4.0–5.6)

## 2020-01-06 MED ORDER — ATORVASTATIN CALCIUM 20 MG PO TABS: 20.0000 mg | ORAL_TABLET | Freq: Every day | ORAL | 3 refills | Status: DC

## 2020-01-06 MED ORDER — LISINOPRIL-HYDROCHLOROTHIAZIDE 20-12.5 MG PO TABS: 1.0000 | ORAL_TABLET | Freq: Every day | ORAL | 0 refills | Status: DC

## 2020-01-06 MED ORDER — LISINOPRIL-HYDROCHLOROTHIAZIDE 20-12.5 MG PO TABS: 1.0000 | ORAL_TABLET | Freq: Every day | ORAL | 1 refills | Status: DC

## 2020-01-06 NOTE — Assessment & Plan Note (Signed)
A1c is improving he has made some great changes it sounds like he is already made some good dietary changes and plans on exercising more regularly.  A1c down to 7.0.  Lets continue with current changes and plan to follow-up in 4 months.  At that point if not able to get A1c under 7 then we will need to make some adjustments to his medication regimen.. Continue current regimen. Follow up in  4 months.

## 2020-01-06 NOTE — Assessment & Plan Note (Signed)
Well controlled. Continue current regimen. Follow up in  6 moths.

## 2020-01-06 NOTE — Progress Notes (Signed)
Established Patient Office Visit  Subjective:  Patient ID: Chad Lawson, male    DOB: [birth year]  Age: 44 y.o. MRN: [id number]  CC:  Chief Complaint  Patient presents with  . Diabetes    HPI Chad Lawson presents for   Hypertension- Pt denies chest pain, SOB, dizziness, or heart palpitations.  Taking meds as directed w/o problems.  Denies medication side effects.    Diabetes - no hypoglycemic events. No wounds or sores that are not healing well. No increased thirst or urination. Checking glucose at home. Taking medications as prescribed without any side effects. He made some dietary changes and plans on starting to exercise.  He bought an elliptical machine but has not started to use it quite yet.  Clines flu vaccine.   Past Medical History:  Diagnosis Date  . GERD (gastroesophageal reflux disease)   . YQMVHQIO(962.9)     Past Surgical History:  Procedure Laterality Date  . NO PAST SURGERIES      Family History  Problem Relation Age of Onset  . Stroke Father   . Heart disease Father        deceased in 02/26/2002  . Diabetes Father   . Hypertension Father   . Hypertension Mother   . Diabetes Mother        severe diabetic retinopathy w/blindness  . Cancer Mother        breast w/mastectomy  . Cancer Maternal Grandmother        uterine  . Cancer Maternal Grandfather        lung cancer    Social History   Socioeconomic History  . Marital status: Married    Spouse name: Not on file  . Number of children: 2  . Years of education: Not on file  . Highest education level: Not on file  Occupational History  . Not on file  Tobacco Use  . Smoking status: Never Smoker  . Smokeless tobacco: Never Used  Substance and Sexual Activity  . Alcohol use: No  . Drug use: No  . Sexual activity: Yes  Other Topics Concern  . Not on file  Social History Narrative   Plays basket ball for fun.   2 sons.     Social Determinants of Health   Financial Resource Strain:    . Difficulty of Paying Living Expenses: Not on file  Food Insecurity:   . Worried About Charity fundraiser in the Last Year: Not on file  . Ran Out of Food in the Last Year: Not on file  Transportation Needs:   . Lack of Transportation (Medical): Not on file  . Lack of Transportation (Non-Medical): Not on file  Physical Activity:   . Days of Exercise per Week: Not on file  . Minutes of Exercise per Session: Not on file  Stress:   . Feeling of Stress : Not on file  Social Connections:   . Frequency of Communication with Friends and Family: Not on file  . Frequency of Social Gatherings with Friends and Family: Not on file  . Attends Religious Services: Not on file  . Active Member of Clubs or Organizations: Not on file  . Attends Archivist Meetings: Not on file  . Marital Status: Not on file  Intimate Partner Violence:   . Fear of Current or Ex-Partner: Not on file  . Emotionally Abused: Not on file  . Physically Abused: Not on file  . Sexually Abused: Not on file  Outpatient Medications Prior to Visit  Medication Sig Dispense Refill  . ACCU-CHEK GUIDE test strip USE 1 STRIP TO CHECK GLUCOSE ONCE DAILY AND ONCE A WEEK 2 HOURS AFTER LARGEST MEAL 100 each prn  . blood glucose meter kit and supplies Check fasting blood sugar daily and once a week 2 hours after largest meal. Elevated blood sugar. 1 each 0  . metFORMIN (GLUCOPHAGE-XR) 500 MG 24 hr tablet Take 1 tablet by mouth once daily with breakfast 90 tablet 3  . atorvastatin (LIPITOR) 20 MG tablet Take 1 tablet (20 mg total) by mouth at bedtime. 90 tablet 3  . lisinopril-hydrochlorothiazide (ZESTORETIC) 20-12.5 MG tablet Take 1 tablet by mouth daily. NO MORE REFILLS UNTIL APPOINTMENT MADE 15 tablet 0   No facility-administered medications prior to visit.    No Known Allergies  ROS Review of Systems    Objective:    Physical Exam  Constitutional: He is oriented to person, place, and time. He appears  well-developed and well-nourished.  HENT:  Head: Normocephalic and atraumatic.  Cardiovascular: Normal rate, regular rhythm and normal heart sounds.  Pulmonary/Chest: Effort normal and breath sounds normal.  Neurological: He is alert and oriented to person, place, and time.  Skin: Skin is warm and dry.  Psychiatric: He has a normal mood and affect. His behavior is normal.    BP 134/87   Pulse 91   Ht _0  (1.93 m)   Wt 288 lb (130.6 kg)   SpO2 97%   BMI 35.06 kg/m  Wt Readings from Last 3 Encounters:  01/06/20 288 lb (130.6 kg)  08/19/19 290 lb (131.5 kg)  05/13/19 291 lb (132 kg)     Health Maintenance Due  Topic Date Due  . HIV Screening  07/25/1990    There are no preventive care reminders to display for this patient.  Lab Results  Component Value Date   TSH 0.740 06/19/2008   Lab Results  Component Value Date   WBC 3.1 (L) 02/04/2019   HGB 15.9 02/04/2019   HCT 48.0 02/04/2019   MCV 86.8 02/04/2019   PLT 249 02/04/2019   Lab Results  Component Value Date   NA 138 02/04/2019   K 4.2 02/04/2019   CO2 28 02/04/2019   GLUCOSE 239 (H) 02/04/2019   BUN 10 02/04/2019   CREATININE 0.93 02/04/2019   BILITOT 1.0 02/04/2019   ALKPHOS 33 (L) 10/03/2016   AST 16 02/04/2019   ALT 22 02/04/2019   PROT 7.5 02/04/2019   ALBUMIN 4.4 10/03/2016   CALCIUM 10.0 02/04/2019   Lab Results  Component Value Date   CHOL 185 02/04/2019   Lab Results  Component Value Date   HDL 47 02/04/2019   Lab Results  Component Value Date   LDLCALC 123 (H) 02/04/2019   Lab Results  Component Value Date   TRIG 63 02/04/2019   Lab Results  Component Value Date   CHOLHDL 3.9 02/04/2019   Lab Results  Component Value Date   HGBA1C 7.0 (A) 01/06/2020      Assessment & Plan:   Problem List Items Addressed This Visit      Cardiovascular and Mediastinum   HYPERTENSION, BENIGN SYSTEMIC    Well controlled. Continue current regimen. Follow up in  6 moths.       Relevant  Medications   atorvastatin (LIPITOR) 20 MG tablet   lisinopril-hydrochlorothiazide (ZESTORETIC) 20-12.5 MG tablet   Other Relevant Orders   Lipid Panel w/reflex Direct LDL   COMPLETE METABOLIC PANEL  WITH GFR     Endocrine   Type 2 diabetes mellitus (HCC) - Primary    A1c is improving he has made some great changes it sounds like he is already made some good dietary changes and plans on exercising more regularly.  A1c down to 7.0.  Lets continue with current changes and plan to follow-up in 4 months.  At that point if not able to get A1c under 7 then we will need to make some adjustments to his medication regimen.. Continue current regimen. Follow up in  4 months.       Relevant Medications   atorvastatin (LIPITOR) 20 MG tablet   lisinopril-hydrochlorothiazide (ZESTORETIC) 20-12.5 MG tablet   Other Relevant Orders   POCT HgB A1C (Completed)   Lipid Panel w/reflex Direct LDL   COMPLETE METABOLIC PANEL WITH GFR      Meds ordered this encounter  Medications  . DISCONTD: lisinopril-hydrochlorothiazide (ZESTORETIC) 20-12.5 MG tablet    Sig: Take 1 tablet by mouth daily.    Dispense:  90 tablet    Refill:  0  . atorvastatin (LIPITOR) 20 MG tablet    Sig: Take 1 tablet (20 mg total) by mouth at bedtime.    Dispense:  90 tablet    Refill:  3  . lisinopril-hydrochlorothiazide (ZESTORETIC) 20-12.5 MG tablet    Sig: Take 1 tablet by mouth daily.    Dispense:  90 tablet    Refill:  1    Follow-up: Return in about 4 months (around 05/05/2020) for Diabetes follow-up.    Beatrice Lecher, MD

## 2020-01-07 LAB — LIPID PANEL W/REFLEX DIRECT LDL
Cholesterol: 111 mg/dL (ref <200)
HDL: 32 mg/dL — ABNORMAL LOW (ref >=40)
LDL Cholesterol (Calc): 67 mg/dL (calc)
Non-HDL Cholesterol (Calc): 79 mg/dL (calc) (ref <130)
Total CHOL/HDL Ratio: 3.5 (calc) (ref <5.0)
Triglycerides: 42 mg/dL (ref <150)

## 2020-01-07 LAB — COMPLETE METABOLIC PANEL WITH GFR
AG Ratio: 1.6 (calc) (ref 1.0–2.5)
ALT: 40 U/L (ref 9–46)
AST: 24 U/L (ref 10–40)
Albumin: 4.7 g/dL (ref 3.6–5.1)
Alkaline phosphatase (APISO): 45 U/L (ref 36–130)
BUN: 13 mg/dL (ref 7–25)
CO2: 30 mmol/L (ref 20–32)
Calcium: 9.8 mg/dL (ref 8.6–10.3)
Chloride: 101 mmol/L (ref 98–110)
Creat: 0.94 mg/dL (ref 0.60–1.35)
GFR, Est African American: 114 mL/min/{1.73_m2} (ref >=60)
GFR, Est Non African American: 98 mL/min/{1.73_m2} (ref >=60)
Globulin: 2.9 g/dL (calc) (ref 1.9–3.7)
Glucose, Bld: 121 mg/dL — ABNORMAL HIGH (ref 65–99)
Potassium: 4.3 mmol/L (ref 3.5–5.3)
Sodium: 140 mmol/L (ref 135–146)
Total Bilirubin: 0.8 mg/dL (ref 0.2–1.2)
Total Protein: 7.6 g/dL (ref 6.1–8.1)

## 2020-05-04 ENCOUNTER — Ambulatory Visit: Payer: PRIVATE HEALTH INSURANCE | Admitting: Family Medicine

## 2020-06-01 ENCOUNTER — Ambulatory Visit: Payer: PRIVATE HEALTH INSURANCE | Admitting: Family Medicine

## 2020-07-02 ENCOUNTER — Ambulatory Visit: Payer: PRIVATE HEALTH INSURANCE | Admitting: Family Medicine

## 2020-07-06 ENCOUNTER — Ambulatory Visit: Payer: PRIVATE HEALTH INSURANCE | Admitting: Family Medicine

## 2020-07-27 ENCOUNTER — Ambulatory Visit: Payer: PRIVATE HEALTH INSURANCE | Admitting: Family Medicine

## 2020-08-10 ENCOUNTER — Ambulatory Visit: Payer: PRIVATE HEALTH INSURANCE | Admitting: Family Medicine

## 2020-09-07 ENCOUNTER — Ambulatory Visit: Payer: PRIVATE HEALTH INSURANCE | Admitting: Family Medicine

## 2020-09-08 ENCOUNTER — Encounter: Payer: Self-pay | Admitting: Family Medicine

## 2020-09-08 ENCOUNTER — Other Ambulatory Visit: Payer: Self-pay

## 2020-09-08 ENCOUNTER — Ambulatory Visit (INDEPENDENT_AMBULATORY_CARE_PROVIDER_SITE_OTHER): Payer: Commercial Managed Care - PPO | Admitting: Family Medicine

## 2020-09-08 VITALS — BP 130/77 | HR 80 | Ht 76.0 in | Wt 293.0 lb

## 2020-09-08 DIAGNOSIS — E119 Type 2 diabetes mellitus without complications: Secondary | ICD-10-CM | POA: Diagnosis not present

## 2020-09-08 DIAGNOSIS — I10 Essential (primary) hypertension: Secondary | ICD-10-CM

## 2020-09-08 LAB — POCT GLYCOSYLATED HEMOGLOBIN (HGB A1C): Hemoglobin A1C: 8.2 % — AB (ref 4.0–5.6)

## 2020-09-08 MED ORDER — DAPAGLIFLOZIN PRO-METFORMIN ER 5-500 MG PO TB24: 1.0000 | ORAL_TABLET | Freq: Every morning | ORAL | 3 refills | Status: DC

## 2020-09-08 NOTE — Assessment & Plan Note (Signed)
A1c not optimally controlled elevated at 8.2 previous was 7.0.  Discussed options.  We will switch him to a combination pill that includes the Metformin.  In addition he will continue to work on healthy dietary choices he admits that he has some room for improvement and also getting back on track with regular exercise of like to see him back in 90 days.

## 2020-09-08 NOTE — Assessment & Plan Note (Signed)
Well controlled. Continue current regimen. Follow up in  56mo

## 2020-09-08 NOTE — Progress Notes (Signed)
Established Patient Office Visit  Subjective:  Patient ID: Chad Lawson, male    DOB: 07/07/1976  Age: 44 y.o. MRN: [id number]  CC:  Chief Complaint  Patient presents with  . Diabetes  . Hypertension    HPI Chad Lawson presents for   Diabetes - no hypoglycemic events. No wounds or sores that are not healing well. No increased thirst or urination. Checking glucose at home.  Typically getting numbers in the 140s nothing greater than 200.  Taking medications as prescribed without any side effects.  Admits he has not been doing the best with his diet and he has not been exercising regularly.  Hypertension- Pt denies chest pain, SOB, dizziness, or heart palpitations.  Taking meds as directed w/o problems.  Denies medication side effects.      Past Medical History:  Diagnosis Date  . GERD (gastroesophageal reflux disease)   . ENIDPOEU(235.3)     Past Surgical History:  Procedure Laterality Date  . NO PAST SURGERIES      Family History  Problem Relation Age of Onset  . Stroke Father   . Heart disease Father        deceased in 03-19-02  . Diabetes Father   . Hypertension Father   . Hypertension Mother   . Diabetes Mother        severe diabetic retinopathy w/blindness  . Cancer Mother        breast w/mastectomy  . Cancer Maternal Grandmother        uterine  . Cancer Maternal Grandfather        lung cancer    Social History   Socioeconomic History  . Marital status: Married    Spouse name: Not on file  . Number of children: 2  . Years of education: Not on file  . Highest education level: Not on file  Occupational History  . Not on file  Tobacco Use  . Smoking status: Never Smoker  . Smokeless tobacco: Never Used  Substance and Sexual Activity  . Alcohol use: No  . Drug use: No  . Sexual activity: Yes  Other Topics Concern  . Not on file  Social History Narrative   Plays basket ball for fun.   2 sons.     Social Determinants of Health    Financial Resource Strain:   . Difficulty of Paying Living Expenses: Not on file  Food Insecurity:   . Worried About Charity fundraiser in the Last Year: Not on file  . Ran Out of Food in the Last Year: Not on file  Transportation Needs:   . Lack of Transportation (Medical): Not on file  . Lack of Transportation (Non-Medical): Not on file  Physical Activity:   . Days of Exercise per Week: Not on file  . Minutes of Exercise per Session: Not on file  Stress:   . Feeling of Stress : Not on file  Social Connections:   . Frequency of Communication with Friends and Family: Not on file  . Frequency of Social Gatherings with Friends and Family: Not on file  . Attends Religious Services: Not on file  . Active Member of Clubs or Organizations: Not on file  . Attends Archivist Meetings: Not on file  . Marital Status: Not on file  Intimate Partner Violence:   . Fear of Current or Ex-Partner: Not on file  . Emotionally Abused: Not on file  . Physically Abused: Not on file  . Sexually Abused:  Not on file    Outpatient Medications Prior to Visit  Medication Sig Dispense Refill  . ACCU-CHEK GUIDE test strip USE 1 STRIP TO CHECK GLUCOSE ONCE DAILY AND ONCE A WEEK 2 HOURS AFTER LARGEST MEAL 100 each prn  . atorvastatin (LIPITOR) 20 MG tablet Take 1 tablet (20 mg total) by mouth at bedtime. 90 tablet 3  . blood glucose meter kit and supplies Check fasting blood sugar daily and once a week 2 hours after largest meal. Elevated blood sugar. 1 each 0  . lisinopril-hydrochlorothiazide (ZESTORETIC) 20-12.5 MG tablet Take 1 tablet by mouth daily. 90 tablet 1  . metFORMIN (GLUCOPHAGE-XR) 500 MG 24 hr tablet Take 1 tablet by mouth once daily with breakfast 90 tablet 3   No facility-administered medications prior to visit.    No Known Allergies  ROS Review of Systems    Objective:    Physical Exam Constitutional:      Appearance: He is well-developed.  HENT:     Head:  Normocephalic and atraumatic.  Cardiovascular:     Rate and Rhythm: Normal rate and regular rhythm.     Heart sounds: Normal heart sounds.  Pulmonary:     Effort: Pulmonary effort is normal.     Breath sounds: Normal breath sounds.  Skin:    General: Skin is warm and dry.  Neurological:     Mental Status: He is alert and oriented to person, place, and time.  Psychiatric:        Behavior: Behavior normal.     BP 130/77   Pulse 80   Ht 6' 4"  (1.93 m)   Wt 293 lb (132.9 kg)   SpO2 99%   BMI 35.67 kg/m  Wt Readings from Last 3 Encounters:  09/08/20 293 lb (132.9 kg)  01/06/20 288 lb (130.6 kg)  08/19/19 290 lb (131.5 kg)     Health Maintenance Due  Topic Date Due  . Hepatitis C Screening  Never done  . HIV Screening  Never done    There are no preventive care reminders to display for this patient.  Lab Results  Component Value Date   TSH 0.740 06/19/2008   Lab Results  Component Value Date   WBC 3.1 (L) 02/04/2019   HGB 15.9 02/04/2019   HCT 48.0 02/04/2019   MCV 86.8 02/04/2019   PLT 249 02/04/2019   Lab Results  Component Value Date   NA 140 01/06/2020   K 4.3 01/06/2020   CO2 30 01/06/2020   GLUCOSE 121 (H) 01/06/2020   BUN 13 01/06/2020   CREATININE 0.94 01/06/2020   BILITOT 0.8 01/06/2020   ALKPHOS 33 (L) 10/03/2016   AST 24 01/06/2020   ALT 40 01/06/2020   PROT 7.6 01/06/2020   ALBUMIN 4.4 10/03/2016   CALCIUM 9.8 01/06/2020   Lab Results  Component Value Date   CHOL 111 01/06/2020   Lab Results  Component Value Date   HDL 32 (L) 01/06/2020   Lab Results  Component Value Date   LDLCALC 67 01/06/2020   Lab Results  Component Value Date   TRIG 42 01/06/2020   Lab Results  Component Value Date   CHOLHDL 3.5 01/06/2020   Lab Results  Component Value Date   HGBA1C 8.2 (A) 09/08/2020      Assessment & Plan:   Problem List Items Addressed This Visit      Cardiovascular and Mediastinum   HYPERTENSION, BENIGN SYSTEMIC    Well  controlled. Continue current regimen. Follow up in  70mo      Relevant Orders   BASIC METABOLIC PANEL WITH GFR     Endocrine   Type 2 diabetes mellitus (HCC) - Primary    A1c not optimally controlled elevated at 8.2 previous was 7.0.  Discussed options.  We will switch him to a combination pill that includes the Metformin.  In addition he will continue to work on healthy dietary choices he admits that he has some room for improvement and also getting back on track with regular exercise of like to see him back in 90 days.      Relevant Medications   Dapagliflozin-metFORMIN HCl ER 5-500 MG TB24   Other Relevant Orders   POCT glycosylated hemoglobin (Hb A1C) (Completed)   BASIC METABOLIC PANEL WITH GFR      Foot exam performed today.    Meds ordered this encounter  Medications  . Dapagliflozin-metFORMIN HCl ER 5-500 MG TB24    Sig: Take 1 tablet by mouth in the morning.    Dispense:  30 tablet    Refill:  3    Follow-up: Return in about 3 months (around 12/08/2020) for Diabetes follow-up.    CBeatrice Lecher MD

## 2020-09-08 NOTE — Progress Notes (Signed)
Established Patient Office Visit  Subjective:  Patient ID: Chad Lawson, male    DOB: Jul 12, 1976  Age: 44 y.o. MRN: [id number]  CC:  Chief Complaint  Patient presents with  . Diabetes  . Hypertension    HPI Chad Lawson presents for   Diabetes - no hypoglycemic events. No wounds or sores that are not healing well. No increased thirst or urination. Checking glucose at home.  Typically getting numbers in the 140s nothing greater than 200.  Taking medications as prescribed without any side effects.  Admits he has not been doing the best with his diet and he has not been exercising regularly.  Hypertension- Pt denies chest pain, SOB, dizziness, or heart palpitations.  Taking meds as directed w/o problems.  Denies medication side effects.      Past Medical History:  Diagnosis Date  . GERD (gastroesophageal reflux disease)   . YQMVHQIO(962.9)     Past Surgical History:  Procedure Laterality Date  . NO PAST SURGERIES      Family History  Problem Relation Age of Onset  . Stroke Father   . Heart disease Father        deceased in 2002-03-12  . Diabetes Father   . Hypertension Father   . Hypertension Mother   . Diabetes Mother        severe diabetic retinopathy w/blindness  . Cancer Mother        breast w/mastectomy  . Cancer Maternal Grandmother        uterine  . Cancer Maternal Grandfather        lung cancer    Social History   Socioeconomic History  . Marital status: Married    Spouse name: Not on file  . Number of children: 2  . Years of education: Not on file  . Highest education level: Not on file  Occupational History  . Not on file  Tobacco Use  . Smoking status: Never Smoker  . Smokeless tobacco: Never Used  Substance and Sexual Activity  . Alcohol use: No  . Drug use: No  . Sexual activity: Yes  Other Topics Concern  . Not on file  Social History Narrative   Plays basket ball for fun.   2 sons.     Social Determinants of Health    Financial Resource Strain:   . Difficulty of Paying Living Expenses: Not on file  Food Insecurity:   . Worried About Charity fundraiser in the Last Year: Not on file  . Ran Out of Food in the Last Year: Not on file  Transportation Needs:   . Lack of Transportation (Medical): Not on file  . Lack of Transportation (Non-Medical): Not on file  Physical Activity:   . Days of Exercise per Week: Not on file  . Minutes of Exercise per Session: Not on file  Stress:   . Feeling of Stress : Not on file  Social Connections:   . Frequency of Communication with Friends and Family: Not on file  . Frequency of Social Gatherings with Friends and Family: Not on file  . Attends Religious Services: Not on file  . Active Member of Clubs or Organizations: Not on file  . Attends Archivist Meetings: Not on file  . Marital Status: Not on file  Intimate Partner Violence:   . Fear of Current or Ex-Partner: Not on file  . Emotionally Abused: Not on file  . Physically Abused: Not on file  . Sexually Abused:  Not on file    Outpatient Medications Prior to Visit  Medication Sig Dispense Refill  . ACCU-CHEK GUIDE test strip USE 1 STRIP TO CHECK GLUCOSE ONCE DAILY AND ONCE A WEEK 2 HOURS AFTER LARGEST MEAL 100 each prn  . atorvastatin (LIPITOR) 20 MG tablet Take 1 tablet (20 mg total) by mouth at bedtime. 90 tablet 3  . blood glucose meter kit and supplies Check fasting blood sugar daily and once a week 2 hours after largest meal. Elevated blood sugar. 1 each 0  . lisinopril-hydrochlorothiazide (ZESTORETIC) 20-12.5 MG tablet Take 1 tablet by mouth daily. 90 tablet 1  . metFORMIN (GLUCOPHAGE-XR) 500 MG 24 hr tablet Take 1 tablet by mouth once daily with breakfast 90 tablet 3   No facility-administered medications prior to visit.    No Known Allergies  ROS Review of Systems    Objective:    Physical Exam Constitutional:      Appearance: He is well-developed.  HENT:     Head:  Normocephalic and atraumatic.  Cardiovascular:     Rate and Rhythm: Normal rate and regular rhythm.     Heart sounds: Normal heart sounds.  Pulmonary:     Effort: Pulmonary effort is normal.     Breath sounds: Normal breath sounds.  Skin:    General: Skin is warm and dry.  Neurological:     Mental Status: He is alert and oriented to person, place, and time.  Psychiatric:        Behavior: Behavior normal.     BP 130/77   Pulse 80   Ht 6' 4"  (1.93 m)   Wt 293 lb (132.9 kg)   SpO2 99%   BMI 35.67 kg/m  Wt Readings from Last 3 Encounters:  09/08/20 293 lb (132.9 kg)  01/06/20 288 lb (130.6 kg)  08/19/19 290 lb (131.5 kg)     Health Maintenance Due  Topic Date Due  . Hepatitis C Screening  Never done  . HIV Screening  Never done    There are no preventive care reminders to display for this patient.  Lab Results  Component Value Date   TSH 0.740 06/19/2008   Lab Results  Component Value Date   WBC 3.1 (L) 02/04/2019   HGB 15.9 02/04/2019   HCT 48.0 02/04/2019   MCV 86.8 02/04/2019   PLT 249 02/04/2019   Lab Results  Component Value Date   NA 140 01/06/2020   K 4.3 01/06/2020   CO2 30 01/06/2020   GLUCOSE 121 (H) 01/06/2020   BUN 13 01/06/2020   CREATININE 0.94 01/06/2020   BILITOT 0.8 01/06/2020   ALKPHOS 33 (L) 10/03/2016   AST 24 01/06/2020   ALT 40 01/06/2020   PROT 7.6 01/06/2020   ALBUMIN 4.4 10/03/2016   CALCIUM 9.8 01/06/2020   Lab Results  Component Value Date   CHOL 111 01/06/2020   Lab Results  Component Value Date   HDL 32 (L) 01/06/2020   Lab Results  Component Value Date   LDLCALC 67 01/06/2020   Lab Results  Component Value Date   TRIG 42 01/06/2020   Lab Results  Component Value Date   CHOLHDL 3.5 01/06/2020   Lab Results  Component Value Date   HGBA1C 8.2 (A) 09/08/2020      Assessment & Plan:   Problem List Items Addressed This Visit      Cardiovascular and Mediastinum   HYPERTENSION, BENIGN SYSTEMIC    Well  controlled. Continue current regimen. Follow up in  24mo      Relevant Orders   BASIC METABOLIC PANEL WITH GFR     Endocrine   Type 2 diabetes mellitus (HCC) - Primary    A1c not optimally controlled elevated at 8.2 previous was 7.0.  Discussed options.  We will switch him to a combination pill that includes the Metformin.  In addition he will continue to work on healthy dietary choices he admits that he has some room for improvement and also getting back on track with regular exercise of like to see him back in 90 days.      Relevant Medications   Dapagliflozin-metFORMIN HCl ER 5-500 MG TB24   Other Relevant Orders   POCT glycosylated hemoglobin (Hb A1C) (Completed)   BASIC METABOLIC PANEL WITH GFR      Foot exam performed today.    Meds ordered this encounter  Medications  . Dapagliflozin-metFORMIN HCl ER 5-500 MG TB24    Sig: Take 1 tablet by mouth in the morning.    Dispense:  30 tablet    Refill:  3    Follow-up: Return in about 3 months (around 12/08/2020) for Diabetes follow-up.    CBeatrice Lecher MD

## 2020-09-09 LAB — BASIC METABOLIC PANEL WITH GFR
BUN: 9 mg/dL (ref 7–25)
CO2: 30 mmol/L (ref 20–32)
Calcium: 9.5 mg/dL (ref 8.6–10.3)
Chloride: 101 mmol/L (ref 98–110)
Creat: 0.92 mg/dL (ref 0.60–1.35)
GFR, Est African American: 116 mL/min/{1.73_m2} (ref >=60)
GFR, Est Non African American: 100 mL/min/{1.73_m2} (ref >=60)
Glucose, Bld: 112 mg/dL (ref 65–139)
Potassium: 3.9 mmol/L (ref 3.5–5.3)
Sodium: 138 mmol/L (ref 135–146)

## 2020-09-09 NOTE — Progress Notes (Signed)
All labs are normal.

## 2020-10-19 ENCOUNTER — Other Ambulatory Visit: Payer: Self-pay | Admitting: Family Medicine

## 2020-10-19 DIAGNOSIS — I10 Essential (primary) hypertension: Secondary | ICD-10-CM

## 2020-12-14 ENCOUNTER — Ambulatory Visit: Payer: Commercial Managed Care - PPO | Admitting: Family Medicine

## 2021-01-12 ENCOUNTER — Other Ambulatory Visit: Payer: Self-pay | Admitting: Family Medicine

## 2021-01-12 DIAGNOSIS — E119 Type 2 diabetes mellitus without complications: Secondary | ICD-10-CM

## 2021-01-12 DIAGNOSIS — I10 Essential (primary) hypertension: Secondary | ICD-10-CM

## 2021-02-01 ENCOUNTER — Ambulatory Visit: Payer: Commercial Managed Care - PPO | Admitting: Family Medicine

## 2021-02-08 ENCOUNTER — Other Ambulatory Visit: Payer: Self-pay

## 2021-02-08 ENCOUNTER — Other Ambulatory Visit: Payer: Self-pay | Admitting: *Deleted

## 2021-02-08 ENCOUNTER — Encounter: Payer: Self-pay | Admitting: Family Medicine

## 2021-02-08 ENCOUNTER — Ambulatory Visit (INDEPENDENT_AMBULATORY_CARE_PROVIDER_SITE_OTHER): Payer: Commercial Managed Care - PPO | Admitting: Family Medicine

## 2021-02-08 VITALS — BP 118/77 | HR 92 | Ht 76.0 in | Wt 294.0 lb

## 2021-02-08 DIAGNOSIS — E119 Type 2 diabetes mellitus without complications: Secondary | ICD-10-CM

## 2021-02-08 DIAGNOSIS — Z1211 Encounter for screening for malignant neoplasm of colon: Secondary | ICD-10-CM | POA: Diagnosis not present

## 2021-02-08 DIAGNOSIS — I10 Essential (primary) hypertension: Secondary | ICD-10-CM | POA: Diagnosis not present

## 2021-02-08 LAB — POCT UA - MICROALBUMIN
Albumin/Creatinine Ratio, Urine, POC: <30
Creatinine, POC: 100 mg/dL
Microalbumin Ur, POC: 30 mg/L

## 2021-02-08 LAB — POCT GLYCOSYLATED HEMOGLOBIN (HGB A1C): Hemoglobin A1C: 8.2 % — AB (ref 4.0–5.6)

## 2021-02-08 MED ORDER — LISINOPRIL-HYDROCHLOROTHIAZIDE 20-12.5 MG PO TABS: 1.0000 | ORAL_TABLET | Freq: Every day | ORAL | 1 refills | Status: DC

## 2021-02-08 MED ORDER — ATORVASTATIN CALCIUM 20 MG PO TABS: 20.0000 mg | ORAL_TABLET | Freq: Every day | ORAL | 3 refills | Status: DC

## 2021-02-08 MED ORDER — XIGDUO XR 10-1000 MG PO TB24: 1.0000 | ORAL_TABLET | Freq: Every day | ORAL | 3 refills | Status: DC

## 2021-02-08 NOTE — Progress Notes (Signed)
Established Patient Office Visit  Subjective:  Patient ID: Chad Lawson, male    DOB: Jun 19, 1976  Age: 45 y.o. MRN: [id number]  CC:  Chief Complaint  Patient presents with  . Diabetes    Pt has a Diabetic eye exam on 03/01/2021  . Hypertension    HPI Chad Lawson presents for   Hypertension- Pt denies chest pain, SOB, dizziness, or heart palpitations.  Taking meds as directed w/o problems.  Denies medication side effects.    Diabetes - no hypoglycemic events. No wounds or sores that are not healing well. No increased thirst or urination. Checking glucose at home. Taking medications as prescribed without any side effects. He reports that his eye exam is scheduled for March.   Past Medical History:  Diagnosis Date  . GERD (gastroesophageal reflux disease)   . [id number](299.2)     Past Surgical History:  Procedure Laterality Date  . NO PAST SURGERIES      Family History  Problem Relation Age of Onset  . Stroke Father   . Heart disease Father        deceased in 2002-02-28  . Diabetes Father   . Hypertension Father   . Hypertension Mother   . Diabetes Mother        severe diabetic retinopathy w/blindness  . Cancer Mother        breast w/mastectomy  . Cancer Maternal Grandmother        uterine  . Cancer Maternal Grandfather        lung cancer    Social History   Socioeconomic History  . Marital status: Married    Spouse name: Not on file  . Number of children: 2  . Years of education: Not on file  . Highest education level: Not on file  Occupational History  . Not on file  Tobacco Use  . Smoking status: Never Smoker  . Smokeless tobacco: Never Used  Substance and Sexual Activity  . Alcohol use: No  . Drug use: No  . Sexual activity: Yes  Other Topics Concern  . Not on file  Social History Narrative   Plays basket ball for fun.   2 sons.     Social Determinants of Health   Financial Resource Strain: Not on file  Food Insecurity: Not on file   Transportation Needs: Not on file  Physical Activity: Not on file  Stress: Not on file  Social Connections: Not on file  Intimate Partner Violence: Not on file    Outpatient Medications Prior to Visit  Medication Sig Dispense Refill  . ACCU-CHEK GUIDE test strip USE 1 STRIP TO CHECK GLUCOSE ONCE DAILY AND ONCE A WEEK 2 HOURS AFTER LARGEST MEAL 100 each prn  . atorvastatin (LIPITOR) 20 MG tablet Take 1 tablet (20 mg total) by mouth at bedtime. 90 tablet 3  . blood glucose meter kit and supplies Check fasting blood sugar daily and once a week 2 hours after largest meal. Elevated blood sugar. 1 each 0  . Dapagliflozin-metFORMIN HCl ER (XIGDUO XR) 5-500 MG TB24 Take 1 tablet by mouth every morning. appt for refills 30 tablet 0  . lisinopril-hydrochlorothiazide (ZESTORETIC) 20-12.5 MG tablet Take 1 tablet by mouth once daily 30 tablet 0   No facility-administered medications prior to visit.    No Known Allergies  ROS Review of Systems    Objective:    Physical Exam Constitutional:      Appearance: He is well-developed and well-nourished.  HENT:  Head: Normocephalic and atraumatic.  Cardiovascular:     Rate and Rhythm: Normal rate and regular rhythm.     Heart sounds: Normal heart sounds.  Pulmonary:     Effort: Pulmonary effort is normal.     Breath sounds: Normal breath sounds.  Skin:    General: Skin is warm and dry.  Neurological:     Mental Status: He is alert and oriented to person, place, and time.  Psychiatric:        Mood and Affect: Mood and affect normal.        Behavior: Behavior normal.     BP 118/77   Pulse 92   Ht 6' 4"  (1.93 m)   Wt 294 lb (133.4 kg)   SpO2 100%   BMI 35.79 kg/m  Wt Readings from Last 3 Encounters:  02/08/21 294 lb (133.4 kg)  09/08/20 293 lb (132.9 kg)  01/06/20 288 lb (130.6 kg)     Health Maintenance Due  Topic Date Due  . Hepatitis C Screening  Never done  . HIV Screening  Never done  . COLONOSCOPY (Pts 45-77yr  Insurance coverage will need to be confirmed)  Never done  . COVID-19 Vaccine (3 - Booster for Pfizer series) 10/23/2020    There are no preventive care reminders to display for this patient.  Lab Results  Component Value Date   TSH 0.740 06/19/2008   Lab Results  Component Value Date   WBC 3.1 (L) 02/04/2019   HGB 15.9 02/04/2019   HCT 48.0 02/04/2019   MCV 86.8 02/04/2019   PLT 249 02/04/2019   Lab Results  Component Value Date   NA 138 09/08/2020   K 3.9 09/08/2020   CO2 30 09/08/2020   GLUCOSE 112 09/08/2020   BUN 9 09/08/2020   CREATININE 0.92 09/08/2020   BILITOT 0.8 01/06/2020   ALKPHOS 33 (L) 10/03/2016   AST 24 01/06/2020   ALT 40 01/06/2020   PROT 7.6 01/06/2020   ALBUMIN 4.4 10/03/2016   CALCIUM 9.5 09/08/2020   Lab Results  Component Value Date   CHOL 111 01/06/2020   Lab Results  Component Value Date   HDL 32 (L) 01/06/2020   Lab Results  Component Value Date   LDLCALC 67 01/06/2020   Lab Results  Component Value Date   TRIG 42 01/06/2020   Lab Results  Component Value Date   CHOLHDL 3.5 01/06/2020   Lab Results  Component Value Date   HGBA1C 8.2 (A) 02/08/2021      Assessment & Plan:   Problem List Items Addressed This Visit      Cardiovascular and Mediastinum   HYPERTENSION, BENIGN SYSTEMIC    Well controlled. Continue current regimen. Follow up in  3-4 months.       Relevant Medications   lisinopril-hydrochlorothiazide (ZESTORETIC) 20-12.5 MG tablet   Other Relevant Orders   COMPLETE METABOLIC PANEL WITH GFR   Lipid panel     Endocrine   Type 2 diabetes mellitus (HCC) - Primary    A1c uncontrolled and still elevated at 8.2 which is what it was back in the fall. I am going to increase his Xigduo to the 09/999. He can double up on his current tabs until he runs out. Follow-up in 3 months. Continue work on [employer] regular exercise to reduce blood sugars.      Relevant Medications   Dapagliflozin-metFORMIN HCl ER  (XIGDUO XR) 09-999 MG TB24   lisinopril-hydrochlorothiazide (ZESTORETIC) 20-12.5 MG tablet   Other  Relevant Orders   POCT glycosylated hemoglobin (Hb A1C) (Completed)   POCT UA - Microalbumin (Completed)   COMPLETE METABOLIC PANEL WITH GFR   Lipid panel    Other Visit Diagnoses    Screening for malignant neoplasm of colon       Relevant Orders   Cologuard      Discussed options for colon cancer screening. He would like to move forward with Cologuard. Get updated blood work.  Meds ordered this encounter  Medications  . Dapagliflozin-metFORMIN HCl ER (XIGDUO XR) 09-999 MG TB24    Sig: Take 1 tablet by mouth daily with breakfast.    Dispense:  90 tablet    Refill:  3  . lisinopril-hydrochlorothiazide (ZESTORETIC) 20-12.5 MG tablet    Sig: Take 1 tablet by mouth daily.    Dispense:  90 tablet    Refill:  1    Needs appt for refills    Follow-up: Return in about 3 months (around 05/08/2021) for Diabetes follow-up.    Beatrice Lecher, MD

## 2021-02-08 NOTE — Patient Instructions (Signed)
Diabetes Mellitus and Nutrition, Adult When you have diabetes, or diabetes mellitus, it is very important to have healthy eating habits because your blood sugar (glucose) levels are greatly affected by what you eat and drink. Eating healthy foods in the right amounts, at about the same times every day, can help you:  Control your blood glucose.  Lower your risk of heart disease.  Improve your blood pressure.  Reach or maintain a healthy weight. What can affect my meal plan? Every person with diabetes is different, and each person has different needs for a meal plan. Your health care provider may recommend that you work with a dietitian to make a meal plan that is best for you. Your meal plan may vary depending on factors such as:  The calories you need.  The medicines you take.  Your weight.  Your blood glucose, blood pressure, and cholesterol levels.  Your activity level.  Other health conditions you have, such as heart or kidney disease. How do carbohydrates affect me? Carbohydrates, also called carbs, affect your blood glucose level more than any other type of food. Eating carbs naturally raises the amount of glucose in your blood. Carb counting is a method for keeping track of how many carbs you eat. Counting carbs is important to keep your blood glucose at a healthy level, especially if you use insulin or take certain oral diabetes medicines. It is important to know how many carbs you can safely have in each meal. This is different for every person. Your dietitian can help you calculate how many carbs you should have at each meal and for each snack. How does alcohol affect me? Alcohol can cause a sudden decrease in blood glucose (hypoglycemia), especially if you use insulin or take certain oral diabetes medicines. Hypoglycemia can be a life-threatening condition. Symptoms of hypoglycemia, such as sleepiness, dizziness, and confusion, are similar to symptoms of having too much  alcohol.  Do not drink alcohol if: ? Your health care provider tells you not to drink. ? You are pregnant, may be pregnant, or are planning to become pregnant.  If you drink alcohol: ? Do not drink on an empty stomach. ? Limit how much you use to:  0-1 drink a day for women.  0-2 drinks a day for men. ? Be aware of how much alcohol is in your drink. In the [place] one drink equals one 12 oz bottle of beer (355 mL), one 5 oz glass of wine (148 mL), or one 1 oz glass of hard liquor (44 mL). ? Keep yourself hydrated with water, diet soda, or unsweetened iced tea.  Keep in mind that regular soda, juice, and other mixers may contain a lot of sugar and must be counted as carbs. What are tips for following this plan? Reading food labels  Start by checking the serving size on the "Nutrition Facts" label of packaged foods and drinks. The amount of calories, carbs, fats, and other nutrients listed on the label is based on one serving of the item. Many items contain more than one serving per package.  Check the total grams (g) of carbs in one serving. You can calculate the number of servings of carbs in one serving by dividing the total carbs by 15. For example, if a food has 30 g of total carbs per serving, it would be equal to 2 servings of carbs.  Check the number of grams (g) of saturated fats and trans fats in one serving. Choose foods that have   a low amount or none of these fats.  Check the number of milligrams (mg) of salt (sodium) in one serving. Most people should limit total sodium intake to less than 2,300 mg per day.  Always check the nutrition information of foods labeled as "low-fat" or "nonfat." These foods may be higher in added sugar or refined carbs and should be avoided.  Talk to your dietitian to identify your daily goals for nutrients listed on the label. Shopping  Avoid buying canned, pre-made, or processed foods. These foods tend to be high in fat, sodium, and added  sugar.  Shop around the outside edge of the grocery store. This is where you will most often find fresh fruits and vegetables, bulk grains, fresh meats, and fresh dairy. Cooking  Use low-heat cooking methods, such as baking, instead of high-heat cooking methods like deep frying.  Cook using healthy oils, such as olive, canola, or sunflower oil.  Avoid cooking with butter, cream, or high-fat meats. Meal planning  Eat meals and snacks regularly, preferably at the same times every day. Avoid going long periods of time without eating.  Eat foods that are high in fiber, such as fresh fruits, vegetables, beans, and whole grains. Talk with your dietitian about how many servings of carbs you can eat at each meal.  Eat 4-6 oz (112-168 g) of lean protein each day, such as lean meat, chicken, fish, eggs, or tofu. One ounce (oz) of lean protein is equal to: ? 1 oz (28 g) of meat, chicken, or fish. ? 1 egg. ?  cup (62 g) of tofu.  Eat some foods each day that contain healthy fats, such as avocado, nuts, seeds, and fish.   What foods should I eat? Fruits Berries. Apples. Oranges. Peaches. Apricots. Plums. Grapes. Mango. Papaya. Pomegranate. Kiwi. Cherries. Vegetables Lettuce. Spinach. Leafy greens, including kale, chard, collard greens, and mustard greens. Beets. Cauliflower. Cabbage. Broccoli. Carrots. Green beans. Tomatoes. Peppers. Onions. Cucumbers. Brussels sprouts. Grains Whole grains, such as whole-wheat or whole-grain bread, crackers, tortillas, cereal, and pasta. Unsweetened oatmeal. Quinoa. Brown or wild rice. Meats and other proteins Seafood. Poultry without skin. Lean cuts of poultry and beef. Tofu. Nuts. Seeds. Dairy Low-fat or fat-free dairy products such as milk, yogurt, and cheese. The items listed above may not be a complete list of foods and beverages you can eat. Contact a dietitian for more information. What foods should I avoid? Fruits Fruits canned with  syrup. Vegetables Canned vegetables. Frozen vegetables with butter or cream sauce. Grains Refined white flour and flour products such as bread, pasta, snack foods, and cereals. Avoid all processed foods. Meats and other proteins Fatty cuts of meat. Poultry with skin. Breaded or fried meats. Processed meat. Avoid saturated fats. Dairy Full-fat yogurt, cheese, or milk. Beverages Sweetened drinks, such as soda or iced tea. The items listed above may not be a complete list of foods and beverages you should avoid. Contact a dietitian for more information. Questions to ask a health care provider  Do I need to meet with a diabetes educator?  Do I need to meet with a dietitian?  What number can I call if I have questions?  When are the best times to check my blood glucose? Where to find more information:  American Diabetes Association: diabetes.org  Academy of Nutrition and Dietetics: www.eatright.org  National Institute of Diabetes and Digestive and Kidney Diseases: www.niddk.nih.gov  Association of Diabetes Care and Education Specialists: www.diabeteseducator.org Summary  It is important to have healthy eating   habits because your blood sugar (glucose) levels are greatly affected by what you eat and drink.  A healthy meal plan will help you control your blood glucose and maintain a healthy lifestyle.  Your health care provider may recommend that you work with a dietitian to make a meal plan that is best for you.  Keep in mind that carbohydrates (carbs) and alcohol have immediate effects on your blood glucose levels. It is important to count carbs and to use alcohol carefully. This information is not intended to replace advice given to you by your health care provider. Make sure you discuss any questions you have with your health care provider. Document Revised: 11/19/2019 Document Reviewed: 11/19/2019 Elsevier Patient Education  2021 Elsevier Inc.  

## 2021-02-08 NOTE — Assessment & Plan Note (Signed)
A1c uncontrolled and still elevated at 8.2 which is what it was back in the fall. I am going to increase his Xigduo to the 09/999. He can double up on his current tabs until he runs out. Follow-up in 3 months. Continue work on [employer] and regular exercise to reduce blood sugars.

## 2021-02-08 NOTE — Assessment & Plan Note (Signed)
Well controlled. Continue current regimen. Follow up in  3-4 months.

## 2021-04-18 LAB — COLOGUARD: Cologuard: NEGATIVE

## 2021-04-27 ENCOUNTER — Telehealth: Payer: Self-pay | Admitting: Family Medicine

## 2021-04-27 LAB — COLOGUARD: COLOGUARD: NEGATIVE

## 2021-04-27 NOTE — Telephone Encounter (Signed)
Call pt: Cologuard was negative.  Repeat colon cancer screening in 3 years.

## 2021-04-27 NOTE — Telephone Encounter (Signed)
Pt advised.

## 2021-05-10 ENCOUNTER — Ambulatory Visit: Payer: Commercial Managed Care - PPO | Admitting: Family Medicine

## 2021-08-18 ENCOUNTER — Other Ambulatory Visit: Payer: Self-pay | Admitting: Family Medicine

## 2021-08-18 DIAGNOSIS — I10 Essential (primary) hypertension: Secondary | ICD-10-CM

## 2021-09-17 ENCOUNTER — Other Ambulatory Visit: Payer: Self-pay | Admitting: Family Medicine

## 2021-09-17 DIAGNOSIS — I10 Essential (primary) hypertension: Secondary | ICD-10-CM

## 2021-10-11 ENCOUNTER — Encounter: Payer: Self-pay | Admitting: Family Medicine

## 2021-10-11 ENCOUNTER — Other Ambulatory Visit: Payer: Self-pay

## 2021-10-11 ENCOUNTER — Ambulatory Visit (INDEPENDENT_AMBULATORY_CARE_PROVIDER_SITE_OTHER): Payer: Commercial Managed Care - PPO | Admitting: Family Medicine

## 2021-10-11 VITALS — BP 114/74 | HR 88 | Ht 76.0 in | Wt 284.0 lb

## 2021-10-11 DIAGNOSIS — E119 Type 2 diabetes mellitus without complications: Secondary | ICD-10-CM

## 2021-10-11 DIAGNOSIS — I10 Essential (primary) hypertension: Secondary | ICD-10-CM | POA: Diagnosis not present

## 2021-10-11 LAB — POCT GLYCOSYLATED HEMOGLOBIN (HGB A1C): Hemoglobin A1C: 6.6 % — AB (ref 4.0–5.6)

## 2021-10-11 NOTE — Assessment & Plan Note (Addendum)
Well controlled.  Even though A1c does look great today still encourage him to work on low-carb and low sugar diet.  Also staying active.  Congratulated him on the 10 pound weight loss.  Continue current regimen. Follow up in  4 mo

## 2021-10-11 NOTE — Progress Notes (Signed)
Established Patient Office Visit  Subjective:  Patient ID: Chad Lawson, male    DOB: 06/16/1976  Age: 45 y.o. MRN: [id number]  CC:  Chief Complaint  Patient presents with   Diabetes    HPI Chad Lawson presents for   Hypertension- Pt denies chest pain, SOB, dizziness, or heart palpitations.  Taking meds as directed w/o problems.  Denies medication side effects.    Diabetes - no hypoglycemic events. No wounds or sores that are not healing well. No increased thirst or urination. Checking glucose at home. Taking medications as prescribed without any side effects.  Not scheduled an eye exam yet.  Has lost 10 pounds since he was last here.  He had been traveling and went on to vacations this summer and so is worried that his blood sugars are elevated he ate a lot of rich foods while he was traveling.  But also did a lot of walking.   Past Medical History:  Diagnosis Date   GERD (gastroesophageal reflux disease)    Headache(784.0)     Past Surgical History:  Procedure Laterality Date   NO PAST SURGERIES      Family History  Problem Relation Age of Onset   Stroke Father    Heart disease Father        deceased in 02/28/2002   Diabetes Father    Hypertension Father    Hypertension Mother    Diabetes Mother        severe diabetic retinopathy w/blindness   Cancer Mother        breast w/mastectomy   Cancer Maternal Grandmother        uterine   Cancer Maternal Grandfather        lung cancer    Social History   Socioeconomic History   Marital status: Married    Spouse name: Not on file   Number of children: 2   Years of education: Not on file   Highest education level: Not on file  Occupational History   Not on file  Tobacco Use   Smoking status: Never   Smokeless tobacco: Never  Substance and Sexual Activity   Alcohol use: No   Drug use: No   Sexual activity: Yes  Other Topics Concern   Not on file  Social History Narrative   Plays basket ball for fun.    2 sons.     Social Determinants of Health   Financial Resource Strain: Not on file  Food Insecurity: Not on file  Transportation Needs: Not on file  Physical Activity: Not on file  Stress: Not on file  Social Connections: Not on file  Intimate Partner Violence: Not on file    Outpatient Medications Prior to Visit  Medication Sig Dispense Refill   ACCU-CHEK GUIDE test strip USE 1 STRIP TO CHECK GLUCOSE ONCE DAILY AND ONCE A WEEK 2 HOURS AFTER LARGEST MEAL 100 each prn   atorvastatin (LIPITOR) 20 MG tablet Take 1 tablet (20 mg total) by mouth at bedtime. 90 tablet 3   blood glucose meter kit and supplies Check fasting blood sugar daily and once a week 2 hours after largest meal. Elevated blood sugar. 1 each 0   Dapagliflozin-metFORMIN HCl ER (XIGDUO XR) 09-999 MG TB24 Take 1 tablet by mouth daily with breakfast. 90 tablet 3   lisinopril-hydrochlorothiazide (ZESTORETIC) 20-12.5 MG tablet TAKE 1 TABLET BY MOUTH ONCE DAILY .  NEEDS  OFFICE  VISIT  FOR  ADDITIONAL  REFILLS 30 tablet 0  No facility-administered medications prior to visit.    No Known Allergies  ROS Review of Systems    Objective:    Physical Exam Constitutional:      Appearance: Normal appearance. He is well-developed.  HENT:     Head: Normocephalic and atraumatic.  Cardiovascular:     Rate and Rhythm: Normal rate and regular rhythm.     Heart sounds: Normal heart sounds.  Pulmonary:     Effort: Pulmonary effort is normal.     Breath sounds: Normal breath sounds.  Skin:    General: Skin is warm and dry.  Neurological:     Mental Status: He is alert and oriented to person, place, and time. Mental status is at baseline.  Psychiatric:        Behavior: Behavior normal.    BP 114/74   Pulse 88   Ht 6' 4"  (1.93 m)   Wt 284 lb (128.8 kg)   SpO2 99%   BMI 34.57 kg/m  Wt Readings from Last 3 Encounters:  10/11/21 284 lb (128.8 kg)  02/08/21 294 lb (133.4 kg)  09/08/20 293 lb (132.9 kg)     Health  Maintenance Due  Topic Date Due   HIV Screening  Never done   Hepatitis C Screening  Never done   OPHTHALMOLOGY EXAM  09/07/2018    There are no preventive care reminders to display for this patient.  Lab Results  Component Value Date   TSH 0.740 06/19/2008   Lab Results  Component Value Date   WBC 3.1 (L) 02/04/2019   HGB 15.9 02/04/2019   HCT 48.0 02/04/2019   MCV 86.8 02/04/2019   PLT 249 02/04/2019   Lab Results  Component Value Date   NA 138 09/08/2020   K 3.9 09/08/2020   CO2 30 09/08/2020   GLUCOSE 112 09/08/2020   BUN 9 09/08/2020   CREATININE 0.92 09/08/2020   BILITOT 0.8 01/06/2020   ALKPHOS 33 (L) 10/03/2016   AST 24 01/06/2020   ALT 40 01/06/2020   PROT 7.6 01/06/2020   ALBUMIN 4.4 10/03/2016   CALCIUM 9.5 09/08/2020   Lab Results  Component Value Date   CHOL 111 01/06/2020   Lab Results  Component Value Date   HDL 32 (L) 01/06/2020   Lab Results  Component Value Date   LDLCALC 67 01/06/2020   Lab Results  Component Value Date   TRIG 42 01/06/2020   Lab Results  Component Value Date   CHOLHDL 3.5 01/06/2020   Lab Results  Component Value Date   HGBA1C 6.6 (A) 10/11/2021      Assessment & Plan:   Problem List Items Addressed This Visit       Cardiovascular and Mediastinum   HYPERTENSION, BENIGN SYSTEMIC    Well controlled. Continue current regimen. Follow up in  4 mo       Relevant Orders   Lipid Panel w/reflex Direct LDL   COMPLETE METABOLIC PANEL WITH GFR   CBC     Endocrine   Type 2 diabetes mellitus ([place]) - Primary    Well controlled.  Even though A1c does look great today still encourage him to work on low-carb and low sugar diet.  Also staying active.  Congratulated him on the 10 pound weight loss.  Continue current regimen. Follow up in  4 mo       Relevant Orders   POCT glycosylated hemoglobin (Hb A1C) (Completed)   Lipid Panel w/reflex Direct LDL   COMPLETE METABOLIC PANEL WITH  GFR   CBC    Encouraged to  schedule for eye exam.  Declined flu vaccine. Due for foot exam.   No orders of the defined types were placed in this encounter.   Follow-up: Return in about 4 months (around 02/11/2022) for Diabetes follow-up.    Beatrice Lecher, MD

## 2021-10-11 NOTE — Assessment & Plan Note (Addendum)
Well controlled. Continue current regimen. Follow up in  4 mo

## 2021-10-12 LAB — COMPLETE METABOLIC PANEL WITH GFR
AG Ratio: 1.6 (calc) (ref 1.0–2.5)
ALT: 22 U/L (ref 9–46)
AST: 19 U/L (ref 10–40)
Albumin: 4.7 g/dL (ref 3.6–5.1)
Alkaline phosphatase (APISO): 48 U/L (ref 36–130)
BUN: 14 mg/dL (ref 7–25)
CO2: 25 mmol/L (ref 20–32)
Calcium: 9.8 mg/dL (ref 8.6–10.3)
Chloride: 101 mmol/L (ref 98–110)
Creat: 0.97 mg/dL (ref 0.60–1.29)
Globulin: 2.9 g/dL (calc) (ref 1.9–3.7)
Glucose, Bld: 101 mg/dL — ABNORMAL HIGH (ref 65–99)
Potassium: 5.1 mmol/L (ref 3.5–5.3)
Sodium: 137 mmol/L (ref 135–146)
Total Bilirubin: 1.2 mg/dL (ref 0.2–1.2)
Total Protein: 7.6 g/dL (ref 6.1–8.1)
eGFR: 98 mL/min/{1.73_m2} (ref >=60)

## 2021-10-12 LAB — CBC
HCT: 50.9 % — ABNORMAL HIGH (ref 38.5–50.0)
Hemoglobin: 16.8 g/dL (ref 13.2–17.1)
MCH: 29.1 pg (ref 27.0–33.0)
MCHC: 33 g/dL (ref 32.0–36.0)
MCV: 88.1 fL (ref 80.0–100.0)
MPV: 9.8 fL (ref 7.5–12.5)
Platelets: 249 10*3/uL (ref 140–400)
RBC: 5.78 10*6/uL (ref 4.20–5.80)
RDW: 12.4 % (ref 11.0–15.0)
WBC: 3.7 10*3/uL — ABNORMAL LOW (ref 3.8–10.8)

## 2021-10-12 LAB — LIPID PANEL W/REFLEX DIRECT LDL
Cholesterol: 111 mg/dL (ref <200)
HDL: 41 mg/dL (ref >=40)
LDL Cholesterol (Calc): 58 mg/dL (calc)
Non-HDL Cholesterol (Calc): 70 mg/dL (calc) (ref <130)
Total CHOL/HDL Ratio: 2.7 (calc) (ref <5.0)
Triglycerides: 43 mg/dL (ref <150)

## 2021-10-12 LAB — SPECIMEN COMPROMISED

## 2021-10-12 NOTE — Progress Notes (Signed)
Your lab work is within acceptable range and there are no concerning findings.

## 2021-10-19 ENCOUNTER — Other Ambulatory Visit: Payer: Self-pay | Admitting: Family Medicine

## 2021-10-19 DIAGNOSIS — I10 Essential (primary) hypertension: Secondary | ICD-10-CM

## 2022-01-20 ENCOUNTER — Other Ambulatory Visit: Payer: Self-pay | Admitting: Neurology

## 2022-01-20 MED ORDER — XIGDUO XR 10-1000 MG PO TB24: 1.0000 | ORAL_TABLET | Freq: Every day | ORAL | 0 refills | Status: DC

## 2022-02-14 ENCOUNTER — Other Ambulatory Visit: Payer: Self-pay | Admitting: Family Medicine

## 2022-02-14 ENCOUNTER — Ambulatory Visit: Payer: Commercial Managed Care - PPO | Admitting: Family Medicine

## 2022-04-17 ENCOUNTER — Other Ambulatory Visit: Payer: Self-pay | Admitting: Family Medicine

## 2022-04-17 DIAGNOSIS — I10 Essential (primary) hypertension: Secondary | ICD-10-CM

## 2022-05-16 ENCOUNTER — Ambulatory Visit: Payer: Commercial Managed Care - PPO | Admitting: Family Medicine

## 2022-06-20 ENCOUNTER — Encounter: Payer: Self-pay | Admitting: Family Medicine

## 2022-06-20 ENCOUNTER — Ambulatory Visit (INDEPENDENT_AMBULATORY_CARE_PROVIDER_SITE_OTHER): Payer: Commercial Managed Care - PPO | Admitting: Family Medicine

## 2022-06-20 VITALS — BP 121/86 | HR 81 | Resp 18 | Ht 76.0 in | Wt 288.0 lb

## 2022-06-20 DIAGNOSIS — I10 Essential (primary) hypertension: Secondary | ICD-10-CM | POA: Diagnosis not present

## 2022-06-20 DIAGNOSIS — E119 Type 2 diabetes mellitus without complications: Secondary | ICD-10-CM

## 2022-06-20 LAB — POCT UA - MICROALBUMIN

## 2022-06-20 LAB — POCT GLYCOSYLATED HEMOGLOBIN (HGB A1C): Hemoglobin A1C: 6.6 % — AB (ref 4.0–5.6)

## 2022-06-20 NOTE — Assessment & Plan Note (Signed)
Well controlled. Continue current regimen. Follow up in  6 mo  

## 2022-06-21 LAB — BASIC METABOLIC PANEL WITH GFR
BUN: 14 mg/dL (ref 7–25)
CO2: 26 mmol/L (ref 20–32)
Calcium: 9.6 mg/dL (ref 8.6–10.3)
Chloride: 103 mmol/L (ref 98–110)
Creat: 0.88 mg/dL (ref 0.60–1.29)
Glucose, Bld: 103 mg/dL — ABNORMAL HIGH (ref 65–99)
Potassium: 4.2 mmol/L (ref 3.5–5.3)
Sodium: 139 mmol/L (ref 135–146)
eGFR: 107 mL/min/{1.73_m2} (ref >=60)

## 2022-07-15 ENCOUNTER — Other Ambulatory Visit: Payer: Self-pay | Admitting: Family Medicine

## 2022-07-15 DIAGNOSIS — I10 Essential (primary) hypertension: Secondary | ICD-10-CM

## 2022-10-16 ENCOUNTER — Other Ambulatory Visit: Payer: Self-pay | Admitting: Family Medicine

## 2022-10-16 DIAGNOSIS — I10 Essential (primary) hypertension: Secondary | ICD-10-CM

## 2022-10-19 ENCOUNTER — Other Ambulatory Visit: Payer: Self-pay | Admitting: Family Medicine

## 2022-10-21 ENCOUNTER — Other Ambulatory Visit: Payer: Self-pay | Admitting: *Deleted

## 2023-01-02 ENCOUNTER — Ambulatory Visit: Payer: Commercial Managed Care - PPO | Admitting: Family Medicine

## 2023-01-14 ENCOUNTER — Other Ambulatory Visit: Payer: Self-pay | Admitting: Family Medicine

## 2023-01-14 DIAGNOSIS — I10 Essential (primary) hypertension: Secondary | ICD-10-CM

## 2023-01-23 ENCOUNTER — Ambulatory Visit: Payer: Commercial Managed Care - PPO | Admitting: Family Medicine

## 2023-02-13 ENCOUNTER — Ambulatory Visit: Payer: Commercial Managed Care - PPO | Admitting: Family Medicine

## 2023-02-13 DIAGNOSIS — Z79899 Other long term (current) drug therapy: Secondary | ICD-10-CM

## 2023-02-13 DIAGNOSIS — Z13220 Encounter for screening for lipoid disorders: Secondary | ICD-10-CM

## 2023-02-13 DIAGNOSIS — I10 Essential (primary) hypertension: Secondary | ICD-10-CM

## 2023-02-13 DIAGNOSIS — E119 Type 2 diabetes mellitus without complications: Secondary | ICD-10-CM

## 2023-02-13 DIAGNOSIS — Z1329 Encounter for screening for other suspected endocrine disorder: Secondary | ICD-10-CM

## 2023-02-27 ENCOUNTER — Ambulatory Visit (INDEPENDENT_AMBULATORY_CARE_PROVIDER_SITE_OTHER): Payer: Commercial Managed Care - PPO | Admitting: Family Medicine

## 2023-02-27 ENCOUNTER — Encounter: Payer: Self-pay | Admitting: Family Medicine

## 2023-02-27 VITALS — BP 123/84 | HR 78 | Ht 76.0 in | Wt 295.1 lb

## 2023-02-27 DIAGNOSIS — I10 Essential (primary) hypertension: Secondary | ICD-10-CM

## 2023-02-27 DIAGNOSIS — Z125 Encounter for screening for malignant neoplasm of prostate: Secondary | ICD-10-CM

## 2023-02-27 DIAGNOSIS — E1169 Type 2 diabetes mellitus with other specified complication: Secondary | ICD-10-CM

## 2023-02-27 LAB — POCT GLYCOSYLATED HEMOGLOBIN (HGB A1C): Hemoglobin A1C: 6.9 % — AB (ref 4.0–5.6)

## 2023-02-27 MED ORDER — ATORVASTATIN CALCIUM 20 MG PO TABS: 20.0000 mg | ORAL_TABLET | Freq: Every day | ORAL | 3 refills | Status: DC

## 2023-02-27 MED ORDER — DAPAGLIFLOZIN PRO-METFORMIN ER 10-1000 MG PO TB24: 1.0000 | ORAL_TABLET | Freq: Every day | ORAL | 3 refills | Status: DC

## 2023-02-27 MED ORDER — LISINOPRIL-HYDROCHLOROTHIAZIDE 20-12.5 MG PO TABS: 1.0000 | ORAL_TABLET | Freq: Every day | ORAL | 3 refills | Status: DC

## 2023-02-27 NOTE — Assessment & Plan Note (Signed)
Blood pressure looks good today.  Continue current regimen.  Due for updated labs today.  Prescription refill sent to pharmacy.  Follow-up in 6 months.

## 2023-02-27 NOTE — Progress Notes (Signed)
Established Patient Office Visit  Subjective   Patient ID: Chad Lawson, male    DOB: [birth year]  Age: 47 y.o. MRN: [id number]  Chief Complaint  Patient presents with   Follow-up    Diabetes     HPI   Diabetes - no hypoglycemic events. No wounds or sores that are not healing well. No increased thirst or urination. Checking glucose at home. Taking medications as prescribed without any side effects.  tolerating stating well with no myalgias or significant side effects.  Lab Results  Component Value Date   CHOL 111 10/11/2021   HDL 41 10/11/2021   LDLCALC 58 10/11/2021   TRIG 43 10/11/2021   CHOLHDL 2.7 10/11/2021    Hypertension- Pt denies chest pain, SOB, dizziness, or heart palpitations.  Taking meds as directed w/o problems.  Denies medication side effects.      ROS    Objective:     BP 123/84 (BP Location: Left Arm, Patient Position: Sitting, Cuff Size: Large)   Pulse 78   Ht '6\' 4"'$  (1.93 m)   Wt 295 lb 1.3 oz (133.8 kg)   SpO2 97%   BMI 35.92 kg/m    Physical Exam Constitutional:      Appearance: He is well-developed.  HENT:     Head: Normocephalic and atraumatic.  Cardiovascular:     Rate and Rhythm: Normal rate and regular rhythm.     Heart sounds: Normal heart sounds.  Pulmonary:     Effort: Pulmonary effort is normal.     Breath sounds: Normal breath sounds.  Skin:    General: Skin is warm and dry.  Neurological:     Mental Status: He is alert and oriented to person, place, and time.  Psychiatric:        Behavior: Behavior normal.      Results for orders placed or performed in visit on 02/27/23  POCT HgB A1C  Result Value Ref Range   Hemoglobin A1C 6.9 (A) 4.0 - 5.6 %   HbA1c POC (<> result, manual entry)     HbA1c, POC (prediabetic range)     HbA1c, POC (controlled diabetic range)        The ASCVD Risk score (Arnett DK, et al., 2019) failed to calculate for the following reasons:   The valid total cholesterol range is 130 to  320 mg/dL    Assessment & Plan:   Problem List Items Addressed This Visit       Cardiovascular and Mediastinum   HYPERTENSION, BENIGN SYSTEMIC    Blood pressure looks good today.  Continue current regimen.  Due for updated labs today.  Prescription refill sent to pharmacy.  Follow-up in 6 months.      Relevant Medications   atorvastatin (LIPITOR) 20 MG tablet   lisinopril-hydrochlorothiazide (ZESTORETIC) 20-12.5 MG tablet   Dapagliflozin Pro-metFORMIN ER (XIGDUO XR) 09-999 MG TB24   Other Relevant Orders   COMPLETE METABOLIC PANEL WITH GFR   Lipid Panel w/reflex Direct LDL   PSA     Endocrine   Type 2 diabetes mellitus (HCC) - Primary    A1c up a little bit at 6.9 today from previous at 6.6.  Continue to work on [employer].  Continue Merleen Nicely it looks like it still covered with his insurance plan.  Try to keep A1c under 7.  Follow-up in 4 months.      Relevant Medications   atorvastatin (LIPITOR) 20 MG tablet   lisinopril-hydrochlorothiazide (ZESTORETIC) 20-12.5 MG tablet  Dapagliflozin Pro-metFORMIN ER (XIGDUO XR) 09-999 MG TB24   Other Relevant Orders   POCT HgB A1C (Completed)   COMPLETE METABOLIC PANEL WITH GFR   Lipid Panel w/reflex Direct LDL   PSA   Other Visit Diagnoses     Screening for prostate cancer       Relevant Orders   PSA      Foot exam performed today.    Return in about 4 months (around 06/29/2023) for Diabetes follow-up, Hypertension.    Beatrice Lecher, MD

## 2023-02-27 NOTE — Assessment & Plan Note (Signed)
A1c up a little bit at 6.9 today from previous at 6.6.  Continue to work on [employer].  Continue Merleen Nicely it looks like it still covered with his insurance plan.  Try to keep A1c under 7.  Follow-up in 4 months.

## 2023-02-28 LAB — LIPID PANEL W/REFLEX DIRECT LDL
Cholesterol: 185 mg/dL (ref <200)
HDL: 54 mg/dL (ref >=40)
LDL Cholesterol (Calc): 117 mg/dL (calc) — ABNORMAL HIGH
Non-HDL Cholesterol (Calc): 131 mg/dL (calc) — ABNORMAL HIGH (ref <130)
Total CHOL/HDL Ratio: 3.4 (calc) (ref <5.0)
Triglycerides: 58 mg/dL (ref <150)

## 2023-02-28 LAB — COMPLETE METABOLIC PANEL WITH GFR
AG Ratio: 1.7 (calc) (ref 1.0–2.5)
ALT: 21 U/L (ref 9–46)
AST: 17 U/L (ref 10–40)
Albumin: 4.8 g/dL (ref 3.6–5.1)
Alkaline phosphatase (APISO): 44 U/L (ref 36–130)
BUN: 13 mg/dL (ref 7–25)
CO2: 28 mmol/L (ref 20–32)
Calcium: 9.8 mg/dL (ref 8.6–10.3)
Chloride: 103 mmol/L (ref 98–110)
Creat: 0.92 mg/dL (ref 0.60–1.29)
Globulin: 2.8 g/dL (calc) (ref 1.9–3.7)
Glucose, Bld: 100 mg/dL — ABNORMAL HIGH (ref 65–99)
Potassium: 4.7 mmol/L (ref 3.5–5.3)
Sodium: 142 mmol/L (ref 135–146)
Total Bilirubin: 0.8 mg/dL (ref 0.2–1.2)
Total Protein: 7.6 g/dL (ref 6.1–8.1)
eGFR: 103 mL/min/{1.73_m2} (ref >=60)

## 2023-02-28 LAB — PSA: PSA: 0.65 ng/mL (ref <=4.00)

## 2023-02-28 NOTE — Progress Notes (Signed)
Chad Lawson, metabolic panel looks great.  LDL cholesterol has jumped up it was 58 last year this year was 117.  Goal is less than 100.  Just make sure you are taking the cholesterol pill daily if you can.  If you are having problems or side effects with that or difficulty getting it at the pharmacy then please let me know.  Continue to work on healthy diet and regular exercise.  Prostate test is normal.

## 2023-07-17 ENCOUNTER — Ambulatory Visit: Payer: Commercial Managed Care - PPO | Admitting: Family Medicine

## 2023-07-17 DIAGNOSIS — I10 Essential (primary) hypertension: Secondary | ICD-10-CM

## 2023-07-17 DIAGNOSIS — E1169 Type 2 diabetes mellitus with other specified complication: Secondary | ICD-10-CM

## 2023-07-18 ENCOUNTER — Telehealth: Payer: Self-pay | Admitting: Family Medicine

## 2023-07-18 NOTE — Telephone Encounter (Signed)
Patient called he states that his prescription for Xigduo 09-999 me TB24  has changed from 15.00 to 35.00 he can't afford to pay please advise

## 2023-08-01 NOTE — Telephone Encounter (Signed)
We can call the pharmacy and see if maybe it would be cheaper if we did it through mail order?  It is unusual for them to reduce the co-pay in August.  Pharmacist may have some suggestions as well.

## 2023-08-02 NOTE — Telephone Encounter (Signed)
Attempted to call pharmacy- pharmacy at lunch will try again at later time.

## 2023-08-03 NOTE — Telephone Encounter (Signed)
Does he still want me to try to send in something a little different.  I would not really know what the co-pay would be but we could certainly try something different if he would like.

## 2023-08-03 NOTE — Telephone Encounter (Signed)
Attempted call to patient. Left a voice mail message requesting a return call.  

## 2024-01-11 ENCOUNTER — Ambulatory Visit: Payer: Commercial Managed Care - PPO | Admitting: Family Medicine

## 2024-02-19 ENCOUNTER — Ambulatory Visit (INDEPENDENT_AMBULATORY_CARE_PROVIDER_SITE_OTHER): Payer: Commercial Managed Care - PPO | Admitting: Family Medicine

## 2024-02-19 VITALS — BP 114/66 | HR 81 | Ht 76.0 in | Wt 289.0 lb

## 2024-02-19 DIAGNOSIS — Z7984 Long term (current) use of oral hypoglycemic drugs: Secondary | ICD-10-CM | POA: Diagnosis not present

## 2024-02-19 DIAGNOSIS — E1169 Type 2 diabetes mellitus with other specified complication: Secondary | ICD-10-CM

## 2024-02-19 DIAGNOSIS — I10 Essential (primary) hypertension: Secondary | ICD-10-CM | POA: Diagnosis not present

## 2024-02-19 LAB — POCT GLYCOSYLATED HEMOGLOBIN (HGB A1C): Hemoglobin A1C: 7.1 % — AB (ref 4.0–5.6)

## 2024-02-19 NOTE — Assessment & Plan Note (Signed)
 A1c is up to 7.1 today.  We discussed options including adjusting his medication and or working on diet and exercise.  He really wants to work on his diet first before making any changes so we will plan to regroup again in the 90 days.  And see if the A1c is back in the 6 range if not we will make some changes then.  He does to workout regularly.  We also discussed cutting back on artificial sweeteners.  Will get up-to-date labs including renal function today.

## 2024-02-19 NOTE — Assessment & Plan Note (Signed)
 Well controlled. Continue current regimen. Follow up in  6 mo

## 2024-02-19 NOTE — Progress Notes (Signed)
   Established Patient Office Visit  Subjective  Patient ID: Chad Lawson, male    DOB: December 15, 1976  Age: 48 y.o. MRN: [id number]  Chief Complaint  Patient presents with   Diabetes   Hypertension    HPI  He says he has been eating a little bit more fried foods recently he does good drinking his water but does drink diet drinks.  He knows he could probably do a little better with his dietary choices.    ROS    Objective:     BP 114/66   Pulse 81   Ht 6\' 4"  (1.93 m)   Wt 289 lb (131.1 kg)   SpO2 99%   BMI 35.18 kg/m    Physical Exam Vitals and nursing note reviewed.  Constitutional:      Appearance: Normal appearance.  HENT:     Head: Normocephalic and atraumatic.  Eyes:     Conjunctiva/sclera: Conjunctivae normal.  Cardiovascular:     Rate and Rhythm: Normal rate and regular rhythm.  Pulmonary:     Effort: Pulmonary effort is normal.     Breath sounds: Normal breath sounds.  Skin:    General: Skin is warm and dry.  Neurological:     Mental Status: He is alert.  Psychiatric:        Mood and Affect: Mood normal.      Results for orders placed or performed in visit on 02/19/24  POCT HgB A1C  Result Value Ref Range   Hemoglobin A1C 7.1 (A) 4.0 - 5.6 %   HbA1c POC (<> result, manual entry)     HbA1c, POC (prediabetic range)     HbA1c, POC (controlled diabetic range)        The 10-year ASCVD risk score (Arnett DK, et al., 2019) is: 11.4%    Assessment & Plan:   Problem List Items Addressed This Visit       Cardiovascular and Mediastinum   HYPERTENSION, BENIGN SYSTEMIC   Well controlled. Continue current regimen. Follow up in  6 mo       Relevant Orders   POCT HgB A1C (Completed)   Lipid Panel With LDL/HDL Ratio   CMP14+EGFR   Urine Microalbumin w/creat. ratio     Endocrine   Type 2 diabetes mellitus (HCC) - Primary   A1c is up to 7.1 today.  We discussed options including adjusting his medication and or working on diet and exercise.   He really wants to work on his diet first before making any changes so we will plan to regroup again in the 90 days.  And see if the A1c is back in the 6 range if not we will make some changes then.  He does to workout regularly.  We also discussed cutting back on artificial sweeteners.  Will get up-to-date labs including renal function today.      Relevant Orders   POCT HgB A1C (Completed)   Lipid Panel With LDL/HDL Ratio   CMP14+EGFR   Urine Microalbumin w/creat. ratio    Also encouraged him to consider getting his Tdap updated.  Return in about 13 weeks (around 05/20/2024) for Diabetes follow-up.    Nani Gasser, MD

## 2024-02-20 LAB — CMP14+EGFR
ALT: 27 [IU]/L (ref 0–44)
AST: 24 [IU]/L (ref 0–40)
Albumin: 4.7 g/dL (ref 4.1–5.1)
Alkaline Phosphatase: 52 [IU]/L (ref 44–121)
BUN/Creatinine Ratio: 14 (ref 9–20)
BUN: 13 mg/dL (ref 6–24)
Bilirubin Total: 0.9 mg/dL (ref 0.0–1.2)
CO2: 22 mmol/L (ref 20–29)
Calcium: 9.6 mg/dL (ref 8.7–10.2)
Chloride: 99 mmol/L (ref 96–106)
Creatinine, Ser: 0.92 mg/dL (ref 0.76–1.27)
Globulin, Total: 2.3 g/dL (ref 1.5–4.5)
Glucose: 102 mg/dL — ABNORMAL HIGH (ref 70–99)
Potassium: 4.6 mmol/L (ref 3.5–5.2)
Sodium: 141 mmol/L (ref 134–144)
Total Protein: 7 g/dL (ref 6.0–8.5)
eGFR: 103 mL/min/{1.73_m2} (ref >59)

## 2024-02-20 LAB — LIPID PANEL WITH LDL/HDL RATIO
Cholesterol, Total: 117 mg/dL (ref 100–199)
HDL: 45 mg/dL (ref >39)
LDL Chol Calc (NIH): 61 mg/dL (ref 0–99)
LDL/HDL Ratio: 1.4 {ratio} (ref 0.0–3.6)
Triglycerides: 48 mg/dL (ref 0–149)
VLDL Cholesterol Cal: 11 mg/dL (ref 5–40)

## 2024-02-20 LAB — MICROALBUMIN / CREATININE URINE RATIO
Creatinine, Urine: 94.3 mg/dL
Microalb/Creat Ratio: 77 mg/g{creat} — ABNORMAL HIGH (ref 0–29)
Microalbumin, Urine: 72.4 ug/mL

## 2024-02-20 NOTE — Progress Notes (Signed)
 Your lab work is within acceptable range and there are no concerning findings.   ?

## 2024-02-20 NOTE — Progress Notes (Signed)
 Please see prior note that labs look normal.  The urine sample just came back showing a little excess protein in the urine.  He is on lisinopril to help protect the kidneys which is great.  We will keep an eye on this.  The A1c down and should make this go away and improve

## 2024-02-29 ENCOUNTER — Other Ambulatory Visit: Payer: Self-pay | Admitting: Family Medicine

## 2024-02-29 DIAGNOSIS — I10 Essential (primary) hypertension: Secondary | ICD-10-CM

## 2024-02-29 DIAGNOSIS — E1169 Type 2 diabetes mellitus with other specified complication: Secondary | ICD-10-CM

## 2024-04-03 ENCOUNTER — Other Ambulatory Visit: Payer: Self-pay | Admitting: Family Medicine

## 2024-04-03 DIAGNOSIS — E1169 Type 2 diabetes mellitus with other specified complication: Secondary | ICD-10-CM

## 2024-04-03 DIAGNOSIS — I10 Essential (primary) hypertension: Secondary | ICD-10-CM

## 2024-05-13 ENCOUNTER — Ambulatory Visit: Payer: Commercial Managed Care - PPO | Admitting: Family Medicine

## 2024-06-24 ENCOUNTER — Ambulatory Visit: Admitting: Family Medicine

## 2024-08-12 ENCOUNTER — Ambulatory Visit (INDEPENDENT_AMBULATORY_CARE_PROVIDER_SITE_OTHER): Admitting: Family Medicine

## 2024-08-12 ENCOUNTER — Encounter: Payer: Self-pay | Admitting: Family Medicine

## 2024-08-12 VITALS — BP 129/76 | HR 82 | Ht 76.0 in | Wt 283.0 lb

## 2024-08-12 DIAGNOSIS — Z1211 Encounter for screening for malignant neoplasm of colon: Secondary | ICD-10-CM | POA: Diagnosis not present

## 2024-08-12 DIAGNOSIS — Z7984 Long term (current) use of oral hypoglycemic drugs: Secondary | ICD-10-CM | POA: Diagnosis not present

## 2024-08-12 DIAGNOSIS — E1169 Type 2 diabetes mellitus with other specified complication: Secondary | ICD-10-CM

## 2024-08-12 DIAGNOSIS — I10 Essential (primary) hypertension: Secondary | ICD-10-CM | POA: Diagnosis not present

## 2024-08-12 LAB — POCT GLYCOSYLATED HEMOGLOBIN (HGB A1C): Hemoglobin A1C: 7.4 % — AB (ref 4.0–5.6)

## 2024-08-12 NOTE — Patient Instructions (Signed)
 Encouraged her to schedule an eye exam when you get a chance.

## 2024-08-12 NOTE — Assessment & Plan Note (Signed)
 Continue Xigduo  for now we did discuss that we could consider switching and adjusting his medication but he really feels like he is just going to work on getting back on track with his diet.  The plan to follow-up in 3 months.  We could consider a GLP-1 and we did discuss that today.  Continue daily use statin.

## 2024-08-12 NOTE — Progress Notes (Signed)
   Established Patient Office Visit  Subjective  Patient ID: Chad Lawson, male    DOB: 10/30/1976  Age: 48 y.o. MRN: [id number]  No chief complaint on file.   HPI  Follow-up diabetes-he took 2 beach vacations and also went to [place] this summer so admits he has not been doing his best with his diet he does not feel like it was crazy off but he definitely was eating differently.  He is still taking his medication without side effects or problems.  He is due for 3-year recall for colon cancer screening.  Hypertension- Pt denies chest pain, SOB, dizziness, or heart palpitations.  Taking meds as directed w/o problems.  Denies medication side effects.       ROS    Objective:     BP 129/76   Pulse 82   Ht 6' 4 (1.93 m)   Wt 283 lb 0.6 oz (128.4 kg)   SpO2 98%   BMI 34.45 kg/m    Physical Exam Vitals and nursing note reviewed.  Constitutional:      Appearance: Normal appearance.  HENT:     Head: Normocephalic and atraumatic.  Eyes:     Conjunctiva/sclera: Conjunctivae normal.  Cardiovascular:     Rate and Rhythm: Normal rate and regular rhythm.  Pulmonary:     Effort: Pulmonary effort is normal.     Breath sounds: Normal breath sounds.  Skin:    General: Skin is warm and dry.  Neurological:     Mental Status: He is alert.  Psychiatric:        Mood and Affect: Mood normal.      Results for orders placed or performed in visit on 08/12/24  POCT HgB A1C  Result Value Ref Range   Hemoglobin A1C 7.4 (A) 4.0 - 5.6 %   HbA1c POC (<> result, manual entry)     HbA1c, POC (prediabetic range)     HbA1c, POC (controlled diabetic range)        The ASCVD Risk score (Arnett DK, et al., 2019) failed to calculate for the following reasons:   The valid total cholesterol range is 130 to 320 mg/dL    Assessment & Plan:   Problem List Items Addressed This Visit       Cardiovascular and Mediastinum   HYPERTENSION, BENIGN SYSTEMIC   Relevant Orders   CMP14+EGFR    B12     Endocrine   Type 2 diabetes mellitus (HCC) - Primary   Continue Xigduo  for now we did discuss that we could consider switching and adjusting his medication but he really feels like he is just going to work on getting back on track with his diet.  The plan to follow-up in 3 months.  We could consider a GLP-1 and we did discuss that today.  Continue daily use statin.      Relevant Orders   POCT HgB A1C (Completed)   CMP14+EGFR   B12   Other Visit Diagnoses       Screen for colon cancer       Relevant Orders   Cologuard       Return in about 3 months (around 11/19/2024) for Diabetes follow-up.    Dorothyann Byars, MD

## 2024-08-13 ENCOUNTER — Ambulatory Visit: Payer: Self-pay | Admitting: Family Medicine

## 2024-08-13 ENCOUNTER — Telehealth: Payer: Self-pay

## 2024-08-13 LAB — VITAMIN B12: Vitamin B-12: 838 pg/mL (ref 232–1245)

## 2024-08-13 LAB — CMP14+EGFR
ALT: 25 IU/L (ref 0–44)
AST: 23 IU/L (ref 0–40)
Albumin: 4.8 g/dL (ref 4.1–5.1)
Alkaline Phosphatase: 55 IU/L (ref 44–121)
BUN/Creatinine Ratio: 15 (ref 9–20)
BUN: 14 mg/dL (ref 6–24)
Bilirubin Total: 1.2 mg/dL (ref 0.0–1.2)
CO2: 20 mmol/L (ref 20–29)
Calcium: 9.5 mg/dL (ref 8.7–10.2)
Chloride: 100 mmol/L (ref 96–106)
Creatinine, Ser: 0.93 mg/dL (ref 0.76–1.27)
Globulin, Total: 2.5 g/dL (ref 1.5–4.5)
Glucose: 113 mg/dL — ABNORMAL HIGH (ref 70–99)
Potassium: 4.2 mmol/L (ref 3.5–5.2)
Sodium: 138 mmol/L (ref 134–144)
Total Protein: 7.3 g/dL (ref 6.0–8.5)
eGFR: 101 mL/min/1.73 (ref >59)

## 2024-08-13 NOTE — Progress Notes (Signed)
 Your lab work is within acceptable range and there are no concerning findings.   ?

## 2024-08-13 NOTE — Telephone Encounter (Signed)
 Copied from CRM [id number]. Topic: Clinical - Lab/Test Results >> Aug 13, 2024 10:49 AM Marda MATSU wrote: Read Lab Results Verbatim  Alvan Dorothyann BIRCH, MD to [facility]     08/13/24  7:50 AM Note     Your lab work is within acceptable range and there are no concerning findings.    Patients stated after hearing what was read he does not need a return call.  Unless there is something else that need to be discussed.   Please advise.

## 2024-08-23 LAB — COLOGUARD: COLOGUARD: NEGATIVE

## 2024-08-27 NOTE — Progress Notes (Signed)
 Great news! Your Cologuard test is negative.  Recommend repeat colon cancer screening in 3 years.

## 2024-10-04 ENCOUNTER — Other Ambulatory Visit: Payer: Self-pay | Admitting: Family Medicine

## 2024-10-04 DIAGNOSIS — E1169 Type 2 diabetes mellitus with other specified complication: Secondary | ICD-10-CM

## 2024-10-04 DIAGNOSIS — I10 Essential (primary) hypertension: Secondary | ICD-10-CM

## 2024-11-11 ENCOUNTER — Ambulatory Visit: Admitting: Family Medicine

## 2024-12-17 ENCOUNTER — Encounter: Payer: Self-pay | Admitting: Family Medicine

## 2024-12-17 ENCOUNTER — Ambulatory Visit (INDEPENDENT_AMBULATORY_CARE_PROVIDER_SITE_OTHER): Admitting: Family Medicine

## 2024-12-17 VITALS — BP 133/87 | HR 80 | Ht 76.0 in | Wt 286.0 lb

## 2024-12-17 DIAGNOSIS — E1169 Type 2 diabetes mellitus with other specified complication: Secondary | ICD-10-CM

## 2024-12-17 DIAGNOSIS — I10 Essential (primary) hypertension: Secondary | ICD-10-CM

## 2024-12-17 DIAGNOSIS — Z7984 Long term (current) use of oral hypoglycemic drugs: Secondary | ICD-10-CM

## 2024-12-17 LAB — POCT GLYCOSYLATED HEMOGLOBIN (HGB A1C): Hemoglobin A1C: 7.4 % — AB (ref 4.0–5.6)

## 2024-12-17 MED ORDER — RYBELSUS 3 MG PO TABS: 3.0000 mg | ORAL_TABLET | Freq: Every day | ORAL | 0 refills | Status: AC

## 2024-12-17 MED ORDER — LISINOPRIL-HYDROCHLOROTHIAZIDE 20-12.5 MG PO TABS: 1.0000 | ORAL_TABLET | Freq: Every day | ORAL | 0 refills | Status: DC

## 2024-12-17 MED ORDER — XIGDUO XR 10-1000 MG PO TB24: 1.0000 | ORAL_TABLET | Freq: Every day | ORAL | 0 refills | Status: DC

## 2024-12-17 MED ORDER — RYBELSUS 7 MG PO TABS: 7.0000 mg | ORAL_TABLET | Freq: Every day | ORAL | 2 refills | Status: AC

## 2024-12-17 MED ORDER — LISINOPRIL-HYDROCHLOROTHIAZIDE 20-12.5 MG PO TABS: 1.0000 | ORAL_TABLET | Freq: Every day | ORAL | 3 refills | Status: AC

## 2024-12-17 NOTE — Progress Notes (Signed)
 "  Established Patient Office Visit  Patient ID: Chad Lawson, male    DOB: [birth year]  Age: 48 y.o. MRN: [id number] PCP: Alvan Dorothyann BIRCH, MD  Chief Complaint  Patient presents with   Diabetes   Hypertension    Subjective:     HPI  Discussed the use of AI scribe software for clinical note transcription with the patient, who gave verbal consent to proceed.  History of Present Illness Chad Lawson is a 48 year old male with diabetes who presents for diabetes management.  Glycemic control - Diabetes managed with Xigduo  - Hemoglobin A1c remains at 7.4%, unchanged from previous result  Dietary adherence - Diet has been inconsistent over the past year - Multiple vacations including trips to [place], [place], and a cruise for wedding anniversary - Did not adhere strictly to diet during these periods  Physical activity - Recently resumed exercising at the [facility] [place] [place] with his wife  Diabetes complications screening - No cuts, sores, or wounds that are not healing - Has not scheduled an eye exam     ROS    Objective:     BP 133/87   Pulse 80   Ht 6' 4 (1.93 m)   Wt 286 lb 0.6 oz (129.7 kg)   SpO2 97%   BMI 34.82 kg/m    Physical Exam Vitals and nursing note reviewed.  Constitutional:      Appearance: Normal appearance.  HENT:     Head: Normocephalic and atraumatic.  Eyes:     Conjunctiva/sclera: Conjunctivae normal.  Cardiovascular:     Rate and Rhythm: Normal rate and regular rhythm.  Pulmonary:     Effort: Pulmonary effort is normal.     Breath sounds: Normal breath sounds.  Skin:    General: Skin is warm and dry.  Neurological:     Mental Status: He is alert.  Psychiatric:        Mood and Affect: Mood normal.      Results for orders placed or performed in visit on 12/17/24  POCT HgB A1C  Result Value Ref Range   Hemoglobin A1C 7.4 (A) 4.0 - 5.6 %   HbA1c POC (<> result, manual entry)     HbA1c, POC (prediabetic range)     HbA1c, POC  (controlled diabetic range)        The ASCVD Risk score (Arnett DK, et al., 2019) failed to calculate for the following reasons:   The valid total cholesterol range is 130 to 320 mg/dL    Assessment & Plan:   Problem List Items Addressed This Visit       Cardiovascular and Mediastinum   HYPERTENSION, BENIGN SYSTEMIC - Primary   Relevant Medications   XIGDUO  XR 09-999 MG TB24   lisinopril -hydrochlorothiazide  (ZESTORETIC ) 20-12.5 MG tablet     Endocrine   Type 2 diabetes mellitus (HCC)   Relevant Medications   XIGDUO  XR 09-999 MG TB24   Semaglutide  (RYBELSUS ) 3 MG TABS   Semaglutide  (RYBELSUS ) 7 MG TABS (Start on 01/14/2025)   lisinopril -hydrochlorothiazide  (ZESTORETIC ) 20-12.5 MG tablet   Other Relevant Orders   POCT HgB A1C (Completed)    Assessment and Plan Assessment & Plan Type 2 diabetes mellitus with other specified complication A1c at 7.4%, goal to reduce below 7%. - Prescribed Rybelsus  pending insurance approval. - Submitted insurance authorization for Rybelsus . - Encouraged dietary modifications and regular exercise, including [employer] attendance. - Advised scheduling an eye exam if not done in 2025.  General Health Maintenance Discussed importance of  routine health maintenance. - Encouraged flu vaccination.    Return in about 3 months (around 03/31/2025).    Dorothyann Byars, MD [facility] at [facility]   "

## 2025-01-17 ENCOUNTER — Other Ambulatory Visit: Payer: Self-pay | Admitting: Family Medicine

## 2025-01-17 DIAGNOSIS — E1169 Type 2 diabetes mellitus with other specified complication: Secondary | ICD-10-CM

## 2025-01-23 ENCOUNTER — Telehealth: Payer: Self-pay

## 2025-01-23 ENCOUNTER — Other Ambulatory Visit (HOSPITAL_COMMUNITY): Payer: Self-pay

## 2025-01-23 NOTE — Telephone Encounter (Signed)
 Pharmacy Patient Advocate Encounter   Received notification from Physician's Office that prior authorization for RYBELSUS  is required/requested.   Insurance verification completed.     Per test claim: Refill too soon. PA is not needed at this time. Next eligible fill date is 02/15/25.   PA cannot be submitted on a drug that already has claim coverage. Patient has a very standard copay for drug already. Only thing I can think that could bring it down maybe a little bit more would be the Rybelsus  copay card. He can register at [email or web address] [email or web address]/[email or web address] and that can help get it as low as $25.      Zackary Steva LABOR, CMA to Alvan Dorothyann BIRCH, MD  Rx Prior Auth Team (Selected Message)     01/23/25 10:33 AM Are we able to look into getting a pa for  RYBELSUS  7mg ? Zackary Steva LABOR, CMA    01/23/25 10:32 AM Note Called pharmacy to notify  that pt has rx refills on 7 mg that was sent 01/14/25, pharmacy processed rx script ready for pick up  co-pay $35, called to notify pt script is ready for pick up, pt was not satisfied with cost, pt asked for pa o medication for cheaper co-pay.    Zackary Steva LABOR, CMA    01/23/25 10:23 AM Note Copied from CRM #[id number]. Topic: Clinical - Prescription Issue >> Jan 23, 2025  9:27 AM Gattis SQUIBB wrote: Reason for CRM: Pt called saying the pharmacy told him he needed to call the office regarding his prescription for the simigutide.  RYBELSUS 

## 2025-01-23 NOTE — Telephone Encounter (Signed)
 Pharmacy Patient Advocate Encounter   Received notification from Physician's Office that prior authorization for RYBELSUS  is required/requested.   Insurance verification completed.     Per test claim: Refill too soon. PA is not needed at this time. Next eligible fill date is 02/15/25.  PA cannot be submitted on a drug that already has claim coverage. Patient has a very standard copay for drug already. Only thing I can think that could bring it down maybe a little bit more would be the Rybelsus  copay card. He can register at [email or web address] .com/savings-[email or web address]-[email or web address] and that can help get it as low as $25.

## 2025-01-23 NOTE — Telephone Encounter (Signed)
 Copied from CRM #[id number]. Topic: Clinical - Prescription Issue >> Jan 23, 2025  9:27 AM Gattis SQUIBB wrote: Reason for CRM: Pt called saying the pharmacy told him he needed to call the office regarding his prescription for the simigutide.  RYBELSUS 

## 2025-01-23 NOTE — Telephone Encounter (Signed)
 Pls jotify pt of below

## 2025-01-23 NOTE — Telephone Encounter (Signed)
 Called pharmacy to notify  that pt has rx refills on 7 mg that was sent 01/14/25, pharmacy processed rx script ready for pick up  co-pay $35, called to notify pt script is ready for pick up, pt was not satisfied with cost, pt asked for pa o medication for cheaper co-pay.

## 2025-03-31 ENCOUNTER — Ambulatory Visit: Admitting: Family Medicine
